# Patient Record
Sex: Female | Born: 1985 | Race: White | Hispanic: No | Marital: Married | Smoking: Never smoker
Health system: Southern US, Community
[De-identification: ages and names within clinical notes are randomized; demographics above are authoritative.]

## PROBLEM LIST (undated history)

## (undated) DIAGNOSIS — R7303 Prediabetes: Secondary | ICD-10-CM

## (undated) DIAGNOSIS — K589 Irritable bowel syndrome without diarrhea: Secondary | ICD-10-CM

## (undated) DIAGNOSIS — K219 Gastro-esophageal reflux disease without esophagitis: Secondary | ICD-10-CM

## (undated) DIAGNOSIS — J45909 Unspecified asthma, uncomplicated: Secondary | ICD-10-CM

## (undated) DIAGNOSIS — N946 Dysmenorrhea, unspecified: Secondary | ICD-10-CM

## (undated) DIAGNOSIS — R51 Headache: Secondary | ICD-10-CM

## (undated) DIAGNOSIS — E538 Deficiency of other specified B group vitamins: Secondary | ICD-10-CM

## (undated) DIAGNOSIS — R519 Headache, unspecified: Secondary | ICD-10-CM

## (undated) DIAGNOSIS — E282 Polycystic ovarian syndrome: Secondary | ICD-10-CM

## (undated) DIAGNOSIS — Z8619 Personal history of other infectious and parasitic diseases: Secondary | ICD-10-CM

## (undated) HISTORY — PX: WISDOM TOOTH EXTRACTION: SHX21

## (undated) HISTORY — DX: Dysmenorrhea, unspecified: N94.6

## (undated) HISTORY — DX: Deficiency of other specified B group vitamins: E53.8

## (undated) HISTORY — DX: Personal history of other infectious and parasitic diseases: Z86.19

## (undated) HISTORY — DX: Irritable bowel syndrome, unspecified: K58.9

## (undated) HISTORY — DX: Prediabetes: R73.03

---
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
## 2007-10-11 ENCOUNTER — Encounter: Admission: RE | Admit: 2007-10-11 | Discharge: 2007-10-11 | Payer: Self-pay

## 2007-12-12 ENCOUNTER — Ambulatory Visit: Payer: Self-pay | Admitting: Obstetrics & Gynecology

## 2007-12-12 ENCOUNTER — Inpatient Hospital Stay (HOSPITAL_COMMUNITY): Admission: AD | Admit: 2007-12-12 | Discharge: 2007-12-13 | Payer: Self-pay | Admitting: Obstetrics & Gynecology

## 2010-11-02 LAB — CBC
MCHC: 32.1
MCV: 87.9
Platelets: 235
RBC: 4.58
RDW: 15.8 — ABNORMAL HIGH

## 2010-11-02 LAB — RPR: RPR Ser Ql: NONREACTIVE

## 2016-09-02 ENCOUNTER — Ambulatory Visit: Payer: Self-pay | Admitting: Surgery

## 2016-09-02 NOTE — H&P (Signed)
Patricia [place] 09/02/2016 2:07 PM Location: [facility] Patient #: [id number] DOB: 03/29/1985 Married / Language: [place] / Race: White Female  History of Present Illness (Chelsea A. Fredricka Bonineonnor MD; 09/02/2016 2:44 PM) Patient words: This is a very pleasant and otherwise healthy 31 year old woman who was referred to discuss laparoscopic cholecystectomy. She began having bouts of right upper quadrant pain with associated nausea in March of this year and she has had 5 attacks since then. The pain typically lasts 2-5 hours and is located in the right subcostal margin. She has altered her diet to a very high-fiber and fat free diet and has actually not had an attack since May of this year. She was evaluated by her primary doctor and an ultrasound was performed and reportedly did show gallstones. She had lab work done that was unremarkable. She was recommended to pursue cholecystectomy however wanted a second opinion and therefore she went to see Dr. Doretha ImusBrambhatt in gastroenterology who has referred her here. She has never had any abdominal surgery before. She also complains of periumbilical pain after physical activities such as Patsy LagerYolanda and thinks she may have a hernia.  She does not smoke or use alcohol. She just moved here from [place] a few months ago. She is a mother to 2 kids.  The patient is a 31 year old female.   Past Surgical History Christianne Dolin([place], [place]; 09/02/2016 2:07 PM) Oral Surgery  Diagnostic Studies History Christianne Dolin(Christ[place], [place]; 09/02/2016 2:07 PM) Colonoscopy never Mammogram never Pap Smear 1-5 years ago  Allergies Christianne Dolin(Christen Lambert, RMA; 09/02/2016 2:09 PM) No Known Allergies 09/02/2016  Medication History Christianne Dolin(Christen Lambert, RMA; 09/02/2016 2:10 PM) MetFORMIN HCl (500MG  Tablet, Oral) Active. Medications Reconciled  Social History Christianne Dolin([place], [place]; 09/02/2016 2:07 PM) Caffeine use Carbonated beverages, Coffee, Tea. No alcohol use No drug use Tobacco use  Never smoker.  Family History Christianne Dolin(Christen [place], [place]; 09/02/2016 2:07 PM) Arthritis Father. Cerebrovascular Accident Father. Diabetes Mellitus Mother. Hypertension Father. Respiratory Condition Mother. Thyroid problems Mother.  Pregnancy / Birth History Christianne Dolin(Christen [place], [place]; 09/02/2016 2:07 PM) Age at menarche 12 years. Gravida 1 Irregular periods Length (months) of breastfeeding >24 Maternal age 30-25 Para 1  Other Problems Christianne Dolin(Christen [place], [place]; 09/02/2016 2:07 PM) Asthma Cholelithiasis Gastroesophageal Reflux Disease High blood pressure Hypercholesterolemia Migraine Headache Umbilical Hernia Repair     Review of Systems Christianne Dolin(Christen Lambert RMA; 09/02/2016 2:07 PM) General Not Present- Appetite Loss, Chills, Fatigue, Fever, Night Sweats, Weight Gain and Weight Loss. Skin Not Present- Change in Wart/Mole, Dryness, Hives, Jaundice, New Lesions, Non-Healing Wounds, Rash and Ulcer. HEENT Present- Wears glasses/contact lenses. Not Present- Earache, Hearing Loss, Hoarseness, Nose Bleed, Oral Ulcers, Ringing in the Ears, Seasonal Allergies, Sinus Pain, Sore Throat, Visual Disturbances and Yellow Eyes. Respiratory Not Present- Bloody sputum, Chronic Cough, Difficulty Breathing, Snoring and Wheezing. Breast Not Present- Breast Mass, Breast Pain, Nipple Discharge and Skin Changes. Cardiovascular Not Present- Chest Pain, Difficulty Breathing Lying Down, Leg Cramps, Palpitations, Rapid Heart Rate, Shortness of Breath and Swelling of Extremities. Gastrointestinal Not Present- Abdominal Pain, Bloating, Bloody Stool, Change in Bowel Habits, Chronic diarrhea, Constipation, Difficulty Swallowing, Excessive gas, Gets full quickly at meals, Hemorrhoids, Indigestion, Nausea, Rectal Pain and Vomiting. Female Genitourinary Not Present- Frequency, Nocturia, Painful Urination, Pelvic Pain and Urgency. Musculoskeletal Not Present- Back Pain, Joint Pain, Joint Stiffness, Muscle Pain,  Muscle Weakness and Swelling of Extremities. Neurological Not Present- Decreased Memory, Fainting, Headaches, Numbness, Seizures, Tingling, Tremor, Trouble walking and Weakness. Psychiatric Not Present- Anxiety, Bipolar, Change in Sleep Pattern,  Depression, Fearful and Frequent crying. Endocrine Not Present- Cold Intolerance, Excessive Hunger, Hair Changes, Heat Intolerance, Hot flashes and New Diabetes. Hematology Not Present- Blood Thinners, Easy Bruising, Excessive bleeding, Gland problems, HIV and Persistent Infections.  Vitals Christianne Dolin(Christen Lambert RMA; 09/02/2016 2:11 PM) 09/02/2016 2:10 PM Weight: 200 lb Height: 63in Body Surface Area: 1.93 m Body Mass Index: 35.43 kg/m  Temp.: 24F  Pulse: 106 (Regular)  BP: 130/90 (Sitting, Left Arm, Standard)      Physical Exam (Chelsea A. Fredricka Bonineonnor MD; 09/02/2016 2:46 PM)  General Note: She is alert and well-appearing  Integumentary Note: Skin is warm and dry  Eye Note: No scleral icterus. Pupils equally round and reactive.  Chest and Lung Exam Note: Unlabored respirations. Symmetrical air entry.  Cardiovascular Note: Regular rate and rhythm, no pedal edema  Abdomen Note: Soft, nontender nondistended. No mass or organomegaly. There is a midline diastases of about 2-1/2 cm in the upper abdomen which coincides with a small umbilical hernia.  Neurologic Note: Grossly intact, normal gait  Neuropsychiatric Note: Normal mood and affect, appropriate insight  Musculoskeletal Note: strength symmetrical throughout, no deformity    Assessment & Plan (Chelsea A. Connor MD; 09/02/2016 2:48 PM)  BILIARY COLIC (K80.50) Story: Her pain and symptoms are classic for biliary colic in the setting of gallstones. Long discussion about the nature of gallstone disease and the indications for surgery. I advised her that surgery is indicated and he will begin having symptoms that she has. We discussed the role for observant management and  continuing a low-fat diet, and discussed the risks of gallstone disease including cholecystitis, cholangitis, pancreatitis and the signs and symptoms of each of these should prompt her to go to the emergency department. We also discussed laparoscopic cholecystectomy, including the nature of the surgery, the anticipated perioperative recovery, the risks of bleeding, infection, pain, scarring, intra-abdominal injury specifically to the common bile duct and sequelae, conversion to open surgery, and the risks of general anesthesia including blood clots, stroke, heart attack, pneumonia and death. She expressed understanding and all her questions were answered today. I recommended that she go ahead with laparoscopic cholecystectomy to which she is agreeable. We will get her scheduled in the coming weeks.

## 2016-10-04 ENCOUNTER — Encounter (HOSPITAL_COMMUNITY): Payer: Self-pay | Admitting: *Deleted

## 2016-10-04 NOTE — Progress Notes (Signed)
Spoke with pt fo pre-op call. Pt denies cardiac history, chest pain or sob. Pt states she is not diabetic. She takes Metformin for PCOS.

## 2016-10-05 ENCOUNTER — Ambulatory Visit (HOSPITAL_COMMUNITY): Payer: 59 | Admitting: Registered Nurse

## 2016-10-05 ENCOUNTER — Encounter (HOSPITAL_COMMUNITY)
Admission: RE | Disposition: A | Discharge status: Home / Self Care | Payer: Self-pay | Source: Ambulatory Visit | Attending: Surgery

## 2016-10-05 ENCOUNTER — Encounter (HOSPITAL_COMMUNITY): Payer: Self-pay | Admitting: *Deleted

## 2016-10-05 ENCOUNTER — Ambulatory Visit (HOSPITAL_COMMUNITY)
Admission: RE | Admit: 2016-10-05 | Discharge: 2016-10-05 | Disposition: A | Discharge status: Home / Self Care | Payer: 59 | Source: Ambulatory Visit | Attending: Surgery | Admitting: Surgery

## 2016-10-05 DIAGNOSIS — Z79899 Other long term (current) drug therapy: Secondary | ICD-10-CM | POA: Diagnosis not present

## 2016-10-05 DIAGNOSIS — K8064 Calculus of gallbladder and bile duct with chronic cholecystitis without obstruction: Secondary | ICD-10-CM | POA: Diagnosis present

## 2016-10-05 DIAGNOSIS — K219 Gastro-esophageal reflux disease without esophagitis: Secondary | ICD-10-CM | POA: Insufficient documentation

## 2016-10-05 DIAGNOSIS — K429 Umbilical hernia without obstruction or gangrene: Secondary | ICD-10-CM | POA: Insufficient documentation

## 2016-10-05 DIAGNOSIS — E7800 Pure hypercholesterolemia, unspecified: Secondary | ICD-10-CM | POA: Insufficient documentation

## 2016-10-05 DIAGNOSIS — E282 Polycystic ovarian syndrome: Secondary | ICD-10-CM | POA: Diagnosis not present

## 2016-10-05 HISTORY — DX: Gastro-esophageal reflux disease without esophagitis: K21.9

## 2016-10-05 HISTORY — DX: Headache, unspecified: R51.9

## 2016-10-05 HISTORY — DX: Unspecified asthma, uncomplicated: J45.909

## 2016-10-05 HISTORY — DX: Polycystic ovarian syndrome: E28.2

## 2016-10-05 HISTORY — DX: Headache: R51

## 2016-10-05 HISTORY — PX: CHOLECYSTECTOMY: SHX55

## 2016-10-05 LAB — CBC
HEMATOCRIT: 36.8 % (ref 36.0–46.0)
HEMOGLOBIN: 12 g/dL (ref 12.0–15.0)
MCH: 27.6 pg (ref 26.0–34.0)
MCHC: 32.6 g/dL (ref 30.0–36.0)
MCV: 84.8 fL (ref 78.0–100.0)
Platelets: 263 10*3/uL (ref 150–400)
RBC: 4.34 MIL/uL (ref 3.87–5.11)
RDW: 13.1 % (ref 11.5–15.5)
WBC: 6.1 10*3/uL (ref 4.0–10.5)

## 2016-10-05 LAB — I-STAT BETA HCG BLOOD, ED (NOT ORDERABLE): I-stat hCG, quantitative: <5 m[IU]/mL (ref <5)

## 2016-10-05 LAB — HCG, SERUM, QUALITATIVE: PREG SERUM: NEGATIVE

## 2016-10-05 SURGERY — LAPAROSCOPIC CHOLECYSTECTOMY
Anesthesia: General | Site: Abdomen

## 2016-10-05 MED ORDER — DEXAMETHASONE SODIUM PHOSPHATE 4 MG/ML IJ SOLN
INTRAMUSCULAR | Status: DC | PRN
  Administered 2016-10-05: 10 mg via INTRAVENOUS

## 2016-10-05 MED ORDER — PHENYLEPHRINE HCL 10 MG/ML IJ SOLN
INTRAMUSCULAR | Status: DC | PRN
  Administered 2016-10-05: 40 ug via INTRAVENOUS
  Administered 2016-10-05: 80 ug via INTRAVENOUS

## 2016-10-05 MED ORDER — GABAPENTIN 300 MG PO CAPS
300.0000 mg | ORAL_CAPSULE | ORAL | Status: AC
  Administered 2016-10-05: 300 mg via ORAL
  Filled 2016-10-05: qty 1

## 2016-10-05 MED ORDER — PROPOFOL 10 MG/ML IV BOLUS
INTRAVENOUS | Status: AC
  Filled 2016-10-05: qty 20

## 2016-10-05 MED ORDER — CELECOXIB 200 MG PO CAPS
400.0000 mg | ORAL_CAPSULE | ORAL | Status: AC
  Administered 2016-10-05: 400 mg via ORAL
  Filled 2016-10-05: qty 2

## 2016-10-05 MED ORDER — CHLORHEXIDINE GLUCONATE 4 % EX LIQD: 60.0000 mL | Freq: Once | CUTANEOUS | Status: DC

## 2016-10-05 MED ORDER — ACETAMINOPHEN 650 MG RE SUPP: 650.0000 mg | RECTAL | Status: DC | PRN

## 2016-10-05 MED ORDER — ROCURONIUM BROMIDE 100 MG/10ML IV SOLN
INTRAVENOUS | Status: DC | PRN
  Administered 2016-10-05: 20 mg via INTRAVENOUS
  Administered 2016-10-05: 40 mg via INTRAVENOUS

## 2016-10-05 MED ORDER — MIDAZOLAM HCL 5 MG/5ML IJ SOLN
INTRAMUSCULAR | Status: DC | PRN
  Administered 2016-10-05: 2 mg via INTRAVENOUS

## 2016-10-05 MED ORDER — FENTANYL CITRATE (PF) 100 MCG/2ML IJ SOLN: 25.0000 ug | INTRAMUSCULAR | Status: DC | PRN

## 2016-10-05 MED ORDER — PHENYLEPHRINE 40 MCG/ML (10ML) SYRINGE FOR IV PUSH (FOR BLOOD PRESSURE SUPPORT)
PREFILLED_SYRINGE | INTRAVENOUS | Status: AC
  Filled 2016-10-05: qty 10

## 2016-10-05 MED ORDER — HYDROCODONE-ACETAMINOPHEN 5-325 MG PO TABS: 1.0000 | ORAL_TABLET | Freq: Four times a day (QID) | ORAL | 0 refills | Status: DC | PRN

## 2016-10-05 MED ORDER — HYDROMORPHONE HCL 1 MG/ML IJ SOLN: 0.2500 mg | INTRAMUSCULAR | Status: DC | PRN

## 2016-10-05 MED ORDER — FENTANYL CITRATE (PF) 250 MCG/5ML IJ SOLN
INTRAMUSCULAR | Status: AC
  Filled 2016-10-05: qty 5

## 2016-10-05 MED ORDER — DOCUSATE SODIUM 100 MG PO CAPS: 100.0000 mg | ORAL_CAPSULE | Freq: Two times a day (BID) | ORAL | 0 refills | Status: AC

## 2016-10-05 MED ORDER — LACTATED RINGERS IV SOLN
INTRAVENOUS | Status: DC
  Administered 2016-10-05 (×2): via INTRAVENOUS

## 2016-10-05 MED ORDER — FENTANYL CITRATE (PF) 100 MCG/2ML IJ SOLN
INTRAMUSCULAR | Status: DC | PRN
  Administered 2016-10-05: 25 ug via INTRAVENOUS
  Administered 2016-10-05: 100 ug via INTRAVENOUS
  Administered 2016-10-05: 50 ug via INTRAVENOUS

## 2016-10-05 MED ORDER — SODIUM CHLORIDE 0.9 % IV SOLN: 250.0000 mL | INTRAVENOUS | Status: DC | PRN

## 2016-10-05 MED ORDER — SODIUM CHLORIDE 0.9% FLUSH: 3.0000 mL | Freq: Two times a day (BID) | INTRAVENOUS | Status: DC

## 2016-10-05 MED ORDER — LIDOCAINE 2% (20 MG/ML) 5 ML SYRINGE
INTRAMUSCULAR | Status: AC
  Filled 2016-10-05: qty 5

## 2016-10-05 MED ORDER — ROCURONIUM BROMIDE 10 MG/ML (PF) SYRINGE
PREFILLED_SYRINGE | INTRAVENOUS | Status: AC
  Filled 2016-10-05: qty 5

## 2016-10-05 MED ORDER — OXYCODONE HCL 5 MG PO TABS
5.0000 mg | ORAL_TABLET | ORAL | Status: DC | PRN
  Administered 2016-10-05: 10 mg via ORAL

## 2016-10-05 MED ORDER — BUPIVACAINE-EPINEPHRINE (PF) 0.25% -1:200000 IJ SOLN
INTRAMUSCULAR | Status: AC
  Filled 2016-10-05: qty 30

## 2016-10-05 MED ORDER — DEXAMETHASONE SODIUM PHOSPHATE 10 MG/ML IJ SOLN
INTRAMUSCULAR | Status: AC
  Filled 2016-10-05: qty 1

## 2016-10-05 MED ORDER — MIDAZOLAM HCL 2 MG/2ML IJ SOLN
INTRAMUSCULAR | Status: AC
  Filled 2016-10-05: qty 2

## 2016-10-05 MED ORDER — GLYCOPYRROLATE 0.2 MG/ML IJ SOLN
INTRAMUSCULAR | Status: DC | PRN
  Administered 2016-10-05: 0.4 mg via INTRAVENOUS

## 2016-10-05 MED ORDER — NEOSTIGMINE METHYLSULFATE 5 MG/5ML IV SOSY
PREFILLED_SYRINGE | INTRAVENOUS | Status: AC
  Filled 2016-10-05: qty 15

## 2016-10-05 MED ORDER — SODIUM CHLORIDE 0.9% FLUSH: 3.0000 mL | INTRAVENOUS | Status: DC | PRN

## 2016-10-05 MED ORDER — NEOSTIGMINE METHYLSULFATE 10 MG/10ML IV SOLN
INTRAVENOUS | Status: DC | PRN
  Administered 2016-10-05: 4 mg via INTRAVENOUS

## 2016-10-05 MED ORDER — OXYCODONE HCL 5 MG PO TABS
ORAL_TABLET | ORAL | Status: AC
  Administered 2016-10-05: 10 mg via ORAL
  Filled 2016-10-05: qty 2

## 2016-10-05 MED ORDER — CEFAZOLIN SODIUM-DEXTROSE 2-4 GM/100ML-% IV SOLN
2.0000 g | INTRAVENOUS | Status: AC
  Administered 2016-10-05: 2 g via INTRAVENOUS

## 2016-10-05 MED ORDER — SODIUM CHLORIDE 0.9 % IR SOLN
Status: DC | PRN
  Administered 2016-10-05: 1000 mL

## 2016-10-05 MED ORDER — ONDANSETRON HCL 4 MG/2ML IJ SOLN
INTRAMUSCULAR | Status: DC | PRN
  Administered 2016-10-05: 4 mg via INTRAVENOUS

## 2016-10-05 MED ORDER — CEFAZOLIN SODIUM-DEXTROSE 2-4 GM/100ML-% IV SOLN
INTRAVENOUS | Status: AC
  Filled 2016-10-05: qty 100

## 2016-10-05 MED ORDER — PROPOFOL 10 MG/ML IV BOLUS
INTRAVENOUS | Status: DC | PRN
  Administered 2016-10-05: 130 mg via INTRAVENOUS

## 2016-10-05 MED ORDER — ACETAMINOPHEN 325 MG PO TABS: 650.0000 mg | ORAL_TABLET | ORAL | Status: DC | PRN

## 2016-10-05 MED ORDER — BUPIVACAINE-EPINEPHRINE 0.25% -1:200000 IJ SOLN
INTRAMUSCULAR | Status: DC | PRN
  Administered 2016-10-05: 12 mL

## 2016-10-05 MED ORDER — ACETAMINOPHEN 500 MG PO TABS
1000.0000 mg | ORAL_TABLET | ORAL | Status: AC
  Administered 2016-10-05: 1000 mg via ORAL
  Filled 2016-10-05: qty 2

## 2016-10-05 MED ORDER — ESMOLOL HCL 100 MG/10ML IV SOLN
INTRAVENOUS | Status: AC
  Filled 2016-10-05: qty 10

## 2016-10-05 MED ORDER — LIDOCAINE 2% (20 MG/ML) 5 ML SYRINGE
INTRAMUSCULAR | Status: DC | PRN
  Administered 2016-10-05: 60 mg via INTRAVENOUS

## 2016-10-05 MED ORDER — 0.9 % SODIUM CHLORIDE (POUR BTL) OPTIME
TOPICAL | Status: DC | PRN
  Administered 2016-10-05: 1000 mL

## 2016-10-05 MED ORDER — ONDANSETRON HCL 4 MG/2ML IJ SOLN
INTRAMUSCULAR | Status: AC
Start: 2016-10-05 — End: ?
  Filled 2016-10-05: qty 2

## 2016-10-05 SURGICAL SUPPLY — 43 items
ADH SKN CLS APL DERMABOND .7 (GAUZE/BANDAGES/DRESSINGS) ×1
APPLIER CLIP 5 13 M/L LIGAMAX5 (MISCELLANEOUS) ×3
APR CLP MED LRG 5 ANG JAW (MISCELLANEOUS) ×1
BAG SPEC RTRVL LRG 6X4 10 (ENDOMECHANICALS) ×1
BLADE CLIPPER SURG (BLADE) IMPLANT
CANISTER SUCT 3000ML PPV (MISCELLANEOUS) ×3 IMPLANT
CHLORAPREP W/TINT 26ML (MISCELLANEOUS) ×3 IMPLANT
CLIP APPLIE 5 13 M/L LIGAMAX5 (MISCELLANEOUS) ×1 IMPLANT
COVER SURGICAL LIGHT HANDLE (MISCELLANEOUS) ×3 IMPLANT
DERMABOND ADVANCED (GAUZE/BANDAGES/DRESSINGS) ×2
DERMABOND ADVANCED .7 DNX12 (GAUZE/BANDAGES/DRESSINGS) ×1 IMPLANT
ELECT REM PT RETURN 9FT ADLT (ELECTROSURGICAL) ×3
ELECTRODE REM PT RTRN 9FT ADLT (ELECTROSURGICAL) ×1 IMPLANT
GLOVE BIO SURGEON STRL SZ 6 (GLOVE) ×5 IMPLANT
GLOVE BIOGEL PI IND STRL 6.5 (GLOVE) ×1 IMPLANT
GLOVE BIOGEL PI IND STRL 7.0 (GLOVE) IMPLANT
GLOVE BIOGEL PI INDICATOR 6.5 (GLOVE) ×2
GLOVE BIOGEL PI INDICATOR 7.0 (GLOVE) ×6
GLOVE INDICATOR 6.5 STRL GRN (GLOVE) ×2 IMPLANT
GOWN STRL REUS W/ TWL LRG LVL3 (GOWN DISPOSABLE) ×3 IMPLANT
GOWN STRL REUS W/TWL LRG LVL3 (GOWN DISPOSABLE) ×9
GRASPER SUT TROCAR 14GX15 (MISCELLANEOUS) ×3 IMPLANT
KIT BASIN OR (CUSTOM PROCEDURE TRAY) ×3 IMPLANT
KIT ROOM TURNOVER OR (KITS) ×3 IMPLANT
NDL HYPO 25GX1X1/2 BEV (NEEDLE) IMPLANT
NDL INSUFFLATION 14GA 120MM (NEEDLE) ×1 IMPLANT
NEEDLE HYPO 25GX1X1/2 BEV (NEEDLE) ×3 IMPLANT
NEEDLE INSUFFLATION 14GA 120MM (NEEDLE) ×3 IMPLANT
NS IRRIG 1000ML POUR BTL (IV SOLUTION) ×3 IMPLANT
PAD ARMBOARD 7.5X6 YLW CONV (MISCELLANEOUS) ×3 IMPLANT
POUCH SPECIMEN RETRIEVAL 10MM (ENDOMECHANICALS) ×3 IMPLANT
SCISSORS LAP 5X35 DISP (ENDOMECHANICALS) ×3 IMPLANT
SET IRRIG TUBING LAPAROSCOPIC (IRRIGATION / IRRIGATOR) ×3 IMPLANT
SLEEVE ENDOPATH XCEL 5M (ENDOMECHANICALS) ×6 IMPLANT
SPECIMEN JAR SMALL (MISCELLANEOUS) ×3 IMPLANT
SUT MNCRL AB 4-0 PS2 18 (SUTURE) ×3 IMPLANT
SYR CONTROL 10ML LL (SYRINGE) ×2 IMPLANT
TOWEL OR 17X24 6PK STRL BLUE (TOWEL DISPOSABLE) ×3 IMPLANT
TOWEL OR 17X26 10 PK STRL BLUE (TOWEL DISPOSABLE) IMPLANT
TRAY LAPAROSCOPIC MC (CUSTOM PROCEDURE TRAY) ×3 IMPLANT
TROCAR XCEL NON-BLD 11X100MML (ENDOMECHANICALS) ×3 IMPLANT
TROCAR XCEL NON-BLD 5MMX100MML (ENDOMECHANICALS) ×3 IMPLANT
TUBING INSUFFLATION (TUBING) ×3 IMPLANT

## 2016-10-05 NOTE — Anesthesia Procedure Notes (Signed)
Procedure Name: Intubation Date/Time: 10/05/2016 10:03 AM Performed by: Caren MacadamARTER, Donivin Wirt W Pre-anesthesia Checklist: Patient identified, Emergency Drugs available, Suction available and Patient being monitored Patient Re-evaluated:Patient Re-evaluated prior to induction Oxygen Delivery Method: Circle system utilized Preoxygenation: Pre-oxygenation with 100% oxygen Induction Type: IV induction Ventilation: Mask ventilation without difficulty Laryngoscope Size: Miller and 2 Grade View: Grade I Tube type: Oral Tube size: 7.0 mm Number of attempts: 1 Airway Equipment and Method: Stylet Placement Confirmation: ETT inserted through vocal cords under direct vision,  positive ETCO2 and breath sounds checked- equal and bilateral Secured at: 22 cm Tube secured with: Tape Dental Injury: Teeth and Oropharynx as per pre-operative assessment

## 2016-10-05 NOTE — Anesthesia Postprocedure Evaluation (Signed)
Anesthesia Post Note  Patient: Patricia Weaver  Procedure(s) Performed: Procedure(s) (LRB): LAPAROSCOPIC CHOLECYSTECTOMY (N/A)     Patient location during evaluation: PACU Anesthesia Type: General Level of consciousness: awake and alert Pain management: pain level controlled Vital Signs Assessment: post-procedure vital signs reviewed and stable Respiratory status: spontaneous breathing, nonlabored ventilation and respiratory function stable Cardiovascular status: blood pressure returned to baseline and stable Postop Assessment: no signs of nausea or vomiting Anesthetic complications: no    Last Vitals:  Vitals:   10/05/16 1155 10/05/16 1211  BP: 109/74 117/76  Pulse: 84 89  Resp: 15   Temp: 36.8 C   SpO2: 99% 100%    Last Pain:  Vitals:   10/05/16 1211  TempSrc:   PainSc: 4                  Layman Gully,W. EDMOND

## 2016-10-05 NOTE — Transfer of Care (Signed)
Immediate Anesthesia Transfer of Care Note  Patient: Patricia Weaver  Procedure(s) Performed: Procedure(s): LAPAROSCOPIC CHOLECYSTECTOMY (N/A)  Patient Location: PACU  Anesthesia Type:General  Level of Consciousness: sedated  Airway & Oxygen Therapy: Patient Spontanous Breathing and Patient connected to face mask oxygen  Post-op Assessment: Report given to RN and Post -op Vital signs reviewed and stable  Post vital signs: Reviewed and stable  Last Vitals:  Vitals:   10/05/16 0820 10/05/16 1126  BP: 132/76 118/71  Pulse: 81 74  Resp: 18 15  Temp: 36.7 C 36.9 C  SpO2: 99% 100%    Last Pain:  Vitals:   10/05/16 0820  TempSrc: Oral      Patients Stated Pain Goal: 7 (10/05/16 0829)  Complications: No apparent anesthesia complications

## 2016-10-05 NOTE — Anesthesia Preprocedure Evaluation (Addendum)
Anesthesia Evaluation  Patient identified by MRN, date of birth, ID band Patient awake    Reviewed: Allergy & Precautions, H&P , NPO status , Patient's Chart, lab work & pertinent test results  Airway Mallampati: II  TM Distance: >3 FB Neck ROM: Full    Dental no notable dental hx. (+) Teeth Intact, Dental Advisory Given   Pulmonary asthma ,    Pulmonary exam normal breath sounds clear to auscultation       Cardiovascular negative cardio ROS   Rhythm:Regular Rate:Normal     Neuro/Psych  Headaches, negative psych ROS   GI/Hepatic Neg liver ROS, GERD  Controlled,  Endo/Other  negative endocrine ROS  Renal/GU negative Renal ROS  negative genitourinary   Musculoskeletal   Abdominal   Peds  Hematology negative hematology ROS (+)   Anesthesia Other Findings   Reproductive/Obstetrics negative OB ROS                            Anesthesia Physical Anesthesia Plan  ASA: II  Anesthesia Plan: General   Post-op Pain Management:    Induction: Intravenous  PONV Risk Score and Plan: 4 or greater and Ondansetron, Dexamethasone and Midazolam  Airway Management Planned: Oral ETT  Additional Equipment:   Intra-op Plan:   Post-operative Plan: Extubation in OR  Informed Consent: I have reviewed the patients History and Physical, chart, labs and discussed the procedure including the risks, benefits and alternatives for the proposed anesthesia with the patient or authorized representative who has indicated his/her understanding and acceptance.   Dental advisory given  Plan Discussed with: CRNA  Anesthesia Plan Comments:        Anesthesia Quick Evaluation

## 2016-10-05 NOTE — H&P (Signed)
Patricia Weaver DOB: Jul 24, 1985 Married / Language: Patricia Weaver / Race: White Female  History of Present Illness  Patient words: This is a very pleasant and otherwise healthy 31 year old woman who was referred to discuss laparoscopic cholecystectomy. She began having bouts of right upper quadrant pain with associated nausea in March of this year and she has had 5 attacks since then. The pain typically lasts 2-5 hours and is located in the right subcostal margin. She has altered her diet to a very high-fiber and fat free diet and has actually not had an attack since May of this year. She was evaluated by her primary doctor and an ultrasound was performed and reportedly did show gallstones. She had lab work done that was unremarkable. She was recommended to pursue cholecystectomy however wanted a second opinion and therefore she went to see Dr. Doretha Weaver in gastroenterology who has referred her here. She has never had any abdominal surgery before. She also complains of periumbilical pain after physical activities such as Patricia Weaver and thinks she may have a hernia.  She does not smoke or use alcohol. She just moved here from [place] a few months ago. She is a Weaver to 2 kids.  Past Surgical History  Oral Surgery  Diagnostic Studies History  Colonoscopy never Mammogram never Pap Smear 1-5 years ago  Allergies  No Known Allergies 09/02/2016  Medication History  Patricia Weaver (500MG  Tablet, Oral) Active. Medications Reconciled  Social History  Caffeine use Carbonated beverages, Coffee, Tea. No alcohol use No drug use Tobacco use Never smoker.  Family History  Patricia Weaver. Patricia Weaver. Patricia Weaver. Patricia Weaver. Patricia Weaver. Patricia Weaver.  Pregnancy / Birth History Age at menarche 12 years. Gravida 1 Irregular periods Length (months) of breastfeeding >24 Maternal age  36-25 Para 1  Other Problems  Asthma Cholelithiasis Gastroesophageal Reflux Disease High blood pressure Hypercholesterolemia Migraine Headache Umbilical Hernia Repair     Review of Systems General Not Present- Appetite Loss, Chills, Fatigue, Fever, Night Sweats, Weight Gain and Weight Loss. Skin Not Present- Change in Wart/Mole, Dryness, Hives, Jaundice, New Lesions, Non-Healing Wounds, Rash and Ulcer. HEENT Present- Wears glasses/contact lenses. Not Present- Earache, Hearing Loss, Hoarseness, Nose Bleed, Oral Ulcers, Ringing in the Ears, Seasonal Allergies, Sinus Pain, Sore Throat, Visual Disturbances and Yellow Eyes. Patricia Not Present- Bloody sputum, Chronic Cough, Difficulty Breathing, Snoring and Wheezing. Breast Not Present- Breast Mass, Breast Pain, Nipple Discharge and Skin Changes. Cardiovascular Not Present- Chest Pain, Difficulty Breathing Lying Down, Leg Cramps, Palpitations, Rapid Heart Rate, Shortness of Breath and Swelling of Extremities. Gastrointestinal Not Present- Abdominal Pain, Bloating, Bloody Stool, Change in Bowel Habits, Chronic diarrhea, Constipation, Difficulty Swallowing, Excessive gas, Gets full quickly at meals, Hemorrhoids, Indigestion, Nausea, Rectal Pain and Vomiting. Female Genitourinary Not Present- Frequency, Nocturia, Painful Urination, Pelvic Pain and Urgency. Musculoskeletal Not Present- Back Pain, Joint Pain, Joint Stiffness, Muscle Pain, Muscle Weakness and Swelling of Extremities. Neurological Not Present- Decreased Memory, Fainting, Headaches, Numbness, Seizures, Tingling, Tremor, Trouble walking and Weakness. Psychiatric Not Present- Anxiety, Bipolar, Change in Sleep Pattern, Depression, Fearful and Frequent crying. Endocrine Not Present- Cold Intolerance, Excessive Hunger, Hair Changes, Heat Intolerance, Hot flashes and New Patricia. Hematology Not Present- Blood Thinners, Easy Bruising, Excessive bleeding, Gland problems, HIV  and Persistent Infections.  Vitals:   10/05/16 0820  BP: 132/76  Pulse: 81  Resp: 18  Temp: 98 F (36.7 C)  SpO2: 99%      Physical Exam   General Note: She is  alert and well-appearing  Integumentary Note: Skin is warm and dry  Eye Note: No scleral icterus. Pupils equally round and reactive.  Chest and Lung Exam Note: Unlabored respirations. Symmetrical air entry.  Cardiovascular Note: Regular rate and rhythm, no pedal edema  Abdomen Note: Soft, nontender nondistended. No mass or organomegaly. There is a midline diastases of about 2-1/2 cm in the upper abdomen which coincides with a small umbilical hernia.  Neurologic Note: Grossly intact, normal gait  Neuropsychiatric Note: Normal mood and affect, appropriate insight  Musculoskeletal Note: strength symmetrical throughout, no deformity    Assessment & Plan   BILIARY COLIC (K80.50) Story: Her pain and symptoms are classic for biliary colic in the setting of gallstones. Long discussion about the nature of gallstone disease and the indications for surgery. I advised her that surgery is indicated and he will begin having symptoms that she has. We discussed the role for observant management and continuing a low-fat diet, and discussed the risks of gallstone disease including cholecystitis, cholangitis, pancreatitis and the signs and symptoms of each of these should prompt her to go to the emergency department. We also discussed laparoscopic cholecystectomy, including the nature of the surgery, the anticipated perioperative recovery, the risks of bleeding, infection, pain, scarring, intra-abdominal injury specifically to the common bile duct and sequelae, conversion to open surgery, and the risks of general anesthesia including blood clots, stroke, heart attack, pneumonia and death. She expressed understanding and all her questions were answered today. I recommended that she go ahead with laparoscopic  cholecystectomy to which she is agreeable. We will get her scheduled in the coming weeks.

## 2016-10-05 NOTE — Discharge Instructions (Signed)
LAPAROSCOPIC SURGERY: POST OP INSTRUCTIONS ° °###################################################################### ° °EAT °Gradually transition to a high fiber diet with a fiber supplement over the next few weeks after discharge.  Start with a pureed / full liquid diet (see below) ° °WALK °Walk an hour a day.  Control your pain to do that.   ° °CONTROL PAIN °Control pain so that you can walk, sleep, tolerate sneezing/coughing, go up/down stairs. ° °HAVE A BOWEL MOVEMENT DAILY °Keep your bowels regular to avoid problems.  OK to try a laxative to override constipation.  OK to use an antidairrheal to slow down diarrhea.  Call if not better after 2 tries ° °CALL IF YOU HAVE PROBLEMS/CONCERNS °Call if you are still struggling despite following these instructions. °Call if you have concerns not answered by these instructions ° °###################################################################### ° ° ° °1. DIET: Follow a light bland diet the first 24 hours after arrival home, such as soup, liquids, crackers, etc.  Be sure to include lots of fluids daily.  Avoid fast food or heavy meals as your are more likely to get nauseated.  Eat a low fat the next few days after surgery.   °2. Take your usually prescribed home medications unless otherwise directed. °3. PAIN CONTROL: °a. Pain is best controlled by a usual combination of three different methods TOGETHER: °i. Ice/Heat °ii. Over the counter pain medication °iii. Prescription pain medication °b. Most patients will experience some swelling and bruising around the incisions.  Ice packs or heating pads (30-60 minutes up to 6 times a day) will help. Use ice for the first few days to help decrease swelling and bruising, then switch to heat to help relax tight/sore spots and speed recovery.  Some people prefer to use ice alone, heat alone, alternating between ice & heat.  Experiment to what works for you.  Swelling and bruising can take several weeks to resolve.   °c. It is  helpful to take an over-the-counter pain medication regularly for the first few weeks.  Choose one of the following that works best for you: °i. Naproxen (Aleve, etc)  Two 220mg tabs twice a day °ii. Ibuprofen (Advil, etc) Three 200mg tabs four times a day (every meal & bedtime) °iii. Acetaminophen (Tylenol, etc) 500-650mg four times a day (every meal & bedtime) °d. A  prescription for pain medication (such as oxycodone, hydrocodone, etc) should be given to you upon discharge.  Take your pain medication as prescribed.  °i. If you are having problems/concerns with the prescription medicine (does not control pain, nausea, vomiting, rash, itching, etc), please call us [phone] to see if we need to switch you to a different pain medicine that will work better for you and/or control your side effect better. °ii. If you need a refill on your pain medication, please contact your pharmacy.  They will contact our office to request authorization. Prescriptions will not be filled after 5 pm or on week-ends. °4. Avoid getting constipated.  Between the surgery and the pain medications, it is common to experience some constipation.  Increasing fluid intake and taking a fiber supplement (such as Metamucil, Citrucel, FiberCon, MiraLax, etc) 1-2 times a day regularly will usually help prevent this problem from occurring.  A mild laxative (prune juice, Milk of Magnesia, MiraLax, etc) should be taken according to package directions if there are no bowel movements after 48 hours.   °5. Watch out for diarrhea.  If you have many loose bowel movements, simplify your diet to bland foods & liquids for   a few days.  Stop any stool softeners and decrease your fiber supplement.  Switching to mild anti-diarrheal medications (Kayopectate, Pepto Bismol) can help.  If this worsens or does not improve, please call us. °6. Wash / shower every day.  You may shower over the skin glue which is waterproof.  No rubbing, scrubbing, lotions or  ointments to incisions. °7. Skin glue will flake off after about 2 weeks.  You may leave the incision open to air.  You may replace a dressing/Band-Aid to cover the incision for comfort if you wish.  °8. ACTIVITIES as tolerated:   °a. You may resume regular (light) daily activities beginning the next day--such as daily self-care, walking, climbing stairs--gradually increasing activities as tolerated.  If you can walk 30 minutes without difficulty, it is safe to try more intense activity such as jogging, treadmill, bicycling, low-impact aerobics, swimming, etc. °b. Save the most intensive and strenuous activity for last such as sit-ups, heavy lifting, contact sports, etc  Refrain from any heavy lifting or straining until you are off narcotics for pain control.   °c. DO NOT PUSH THROUGH PAIN.  Let pain be your guide: If it hurts to do something, don't do it.  Pain is your body warning you to avoid that activity for another week until the pain goes down. °d. You may drive when you are no longer taking prescription pain medication, you can comfortably wear a seatbelt, and you can safely maneuver your car and apply brakes. °e. You may have sexual intercourse when it is comfortable.  °9. FOLLOW UP in our office °a. Please call [facility] at [phone] to set up an appointment to see your surgeon in the office for a follow-up appointment approximately 2-3 weeks after your surgery. °b. Make sure that you call for this appointment the day you arrive home to insure a convenient appointment time. °10. IF YOU HAVE DISABILITY OR FAMILY LEAVE FORMS, BRING THEM TO THE OFFICE FOR PROCESSING.  DO NOT GIVE THEM TO YOUR DOCTOR. ° ° °WHEN TO CALL US [phone]: °1. Poor pain control °2. Reactions / problems with new medications (rash/itching, nausea, etc)  °3. Fever over 101.5 F (38.5 C) °4. Inability to urinate °5. Nausea and/or vomiting °6. Worsening swelling or bruising °7. Continued bleeding from incision. °8. Increased pain,  redness, or drainage from the incision ° ° The clinic staff is available to answer your questions during regular business hours (8:30am-5pm).  Please don’t hesitate to call and ask to speak to one of our nurses for clinical concerns.  ° If you have a medical emergency, go to the nearest emergency room or call 911. ° A surgeon from [facility] is always on call at the hospitals ° ° °[facility] °[address], [address], [place], [place]  [address] ? °MAIN: [phone] ? TOLL FREE: [phone] ?  °FAX [phone] °[email or web address] ° ° °

## 2016-10-05 NOTE — Op Note (Signed)
Operative Note  Patricia Humphreyaryn Cottier 31 y.o. female [id number]  10/05/2016  Surgeon: Berna Buehelsea A Takina Busser   Assistant: OR staff  Procedure performed: Laparoscopic Cholecystectomy  Preop diagnosis: biliary colic Post-op diagnosis/intraop findings: same  Specimens: gallbladder  EBL: minimal  Complications: none  Description of procedure: After obtaining informed consent the patient was brought to the operating room. Prophylactic antibiotics and subcutaneous heparin were administered. SCD's were applied. General endotracheal anesthesia was initiated and a formal time-out was performed. The abdomen was prepped and draped in the usual sterile fashion and the abdomen was entered using an infraumbilical veress needle after instilling the site with local. Insufflation to 15mmHg was obtained and gross inspection revealed no evidence of injury from our entry or other intraabdominal abnormalities. There is a sub-centimeter umbilical hernia occupied with preperitoneal fat. Two 5mm trocars were introduced in the right midclavicular and right anterior axillary lines under direct visualization and following infiltration with local. An 11mm trocar was placed in the epigastrium. The gallbladder was retracted cephalad and the infundibulum was retracted laterally. A combination of hook electrocautery and blunt dissection was utilized to clear the peritoneum from the neck and cystic duct, circumferentially isolating the cystic artery and cystic duct and lifting the gallbladder from the cystic plate. The critical view of safety was achieved with the cystic artery, cystic duct, and liver bed visualized between them with no other structures. The artery was clipped with a single clip proximally and distally and divided as was the cystic duct with two clips on the proximal end. The gallbladder was dissected from the liver plate using electrocautery. Once freed the gallbladder was placed in an endocatch bag and removed through the  epigastric trocar site, which had to be extended significantly to permit the large stones within the gallbladder. Some bile but no stones had been spilled from the gallbladder during its dissection from the liver bed. This was aspirated and the right upper quadrant was irrigated copiously until the effluent was clear. Hemostasis was once again confirmed, and reinspection of the abdomen revealed no injuries. The clips were well opposed without any bile leak from the duct or the liver bed. The 11mm trocar site in the epigastrium was closed with a figure-of-eight 0 vicryl in the fascia under direct visualization using a PMI device. The abdomen was desufflated and all trocars removed. The skin incisions were closed with running subcuticular monocryl and Dermabond. The patient was awakened, extubated and transported to the recovery room in stable condition.   All counts were correct at the completion of the case.

## 2016-10-06 ENCOUNTER — Encounter (HOSPITAL_COMMUNITY): Payer: Self-pay | Admitting: Surgery

## 2018-07-07 ENCOUNTER — Other Ambulatory Visit: Payer: Self-pay | Admitting: *Deleted

## 2018-07-07 DIAGNOSIS — Z20822 Contact with and (suspected) exposure to covid-19: Secondary | ICD-10-CM

## 2018-07-09 LAB — NOVEL CORONAVIRUS, NAA: SARS-CoV-2, NAA: NOT DETECTED

## 2018-11-13 ENCOUNTER — Encounter: Payer: Self-pay | Admitting: Family Medicine

## 2018-11-13 ENCOUNTER — Other Ambulatory Visit: Payer: Self-pay

## 2018-11-13 ENCOUNTER — Ambulatory Visit (INDEPENDENT_AMBULATORY_CARE_PROVIDER_SITE_OTHER): Payer: 59 | Admitting: Family Medicine

## 2018-11-13 VITALS — BP 124/82 | HR 88 | Temp 98.5°F | Resp 12 | Ht 63.0 in | Wt 233.4 lb

## 2018-11-13 DIAGNOSIS — R197 Diarrhea, unspecified: Secondary | ICD-10-CM

## 2018-11-13 DIAGNOSIS — K915 Postcholecystectomy syndrome: Secondary | ICD-10-CM

## 2018-11-13 DIAGNOSIS — G4452 New daily persistent headache (NDPH): Secondary | ICD-10-CM

## 2018-11-13 DIAGNOSIS — E282 Polycystic ovarian syndrome: Secondary | ICD-10-CM

## 2018-11-13 DIAGNOSIS — E538 Deficiency of other specified B group vitamins: Secondary | ICD-10-CM

## 2018-11-13 DIAGNOSIS — H469 Unspecified optic neuritis: Secondary | ICD-10-CM

## 2018-11-13 DIAGNOSIS — R519 Headache, unspecified: Secondary | ICD-10-CM | POA: Diagnosis not present

## 2018-11-13 DIAGNOSIS — E669 Obesity, unspecified: Secondary | ICD-10-CM | POA: Insufficient documentation

## 2018-11-13 DIAGNOSIS — Z6841 Body Mass Index (BMI) 40.0 and over, adult: Secondary | ICD-10-CM

## 2018-11-13 LAB — COMPREHENSIVE METABOLIC PANEL
ALT: 41 U/L — ABNORMAL HIGH (ref 0–35)
AST: 26 U/L (ref 0–37)
Albumin: 4.5 g/dL (ref 3.5–5.2)
Alkaline Phosphatase: 54 U/L (ref 39–117)
BUN: 11 mg/dL (ref 6–23)
CO2: 27 mEq/L (ref 19–32)
Calcium: 9.8 mg/dL (ref 8.4–10.5)
Chloride: 105 mEq/L (ref 96–112)
Creatinine, Ser: 0.74 mg/dL (ref 0.40–1.20)
GFR: 90.78 mL/min (ref >60.00)
Glucose, Bld: 86 mg/dL (ref 70–99)
Potassium: 4.9 mEq/L (ref 3.5–5.1)
Sodium: 140 mEq/L (ref 135–145)
Total Bilirubin: 0.3 mg/dL (ref 0.2–1.2)
Total Protein: 7.2 g/dL (ref 6.0–8.3)

## 2018-11-13 LAB — VITAMIN B12: Vitamin B-12: 375 pg/mL (ref 211–911)

## 2018-11-13 MED ORDER — METFORMIN HCL ER 500 MG PO TB24: 500.0000 mg | ORAL_TABLET | Freq: Two times a day (BID) | ORAL | 1 refills | Status: DC

## 2018-11-13 MED ORDER — CHOLESTYRAMINE 4 G PO PACK
4.0000 g | PACK | Freq: Every day | ORAL | 3 refills | Status: DC
Start: 2018-11-13 — End: 2019-01-11

## 2018-11-13 NOTE — Progress Notes (Signed)
HPI:   Patricia.Patricia Weaver is a 33 y.o. female, who is here today to establish care.  Former PCP: Patricia Patricia BarthelWendy Fields, FNP. Last preventive routine visit: 2019. She lives with her husband and 32 year old son.  Chronic medical problems: Obesity,PCOS,chronic diarrhea, and migraines among some.   Concerns today: Temporal headaches, medication refill, and diarrhea.  She was diagnosed with migraines in her early teens, usually she has 2-3 episodes per month.  Her typical migraine is usually temporal, right or left, 4/10,associated photophobia and nausea.  Sometimes she has tingling sensation in right side of her body that lasts a few minutes before headache starts.  For the past 3 to 4 months she has had another type of headache. She has not identified exacerbating or alleviating factors. Headache is dull/achy pain, 3-4/10. This headache is not associated with photophobia, nausea, vomiting, or focal neurologic deficit.  According to patient, during her last eye exam she was told she had a "swollen ophthalmic nerve." She has not had head imaging. For the past few weeks she has noted right eye pain upon movement. Negative for visual abnormalities, conjunctival erythema, or discharge.  She denies fever, chills, night sweats, abnormal weight loss, or unusual fatigue.   PCOS: Currently she is on Metformin XR 500 mg twice daily, which has helped with menstrual regularity. She stopped taking medication for about 3 months due to issues with insurance/mail order, started back with irregular menses. She has tolerated the medication well.  She exercises regularly, walks daily and dance a few times per week. She does not follow a healthful diet consistently.  Diarrhea: daily 3-5 watery, yellowish stools.  She has not noted blood or mucus in the stool. Symptoms seems to be worse in the morning after breakfast, she has 3 episodes back to back and in the afternoon 1-2. Intermittent  generalized abdominal cramps, that resolved after defecation.  Negative for sick contacts or travel around the time she started with diarrhea. Problem has been a stable. She has not tried OTC medication. She has had problem since 2018, when she had cholecystectomy. Reporting negative work up done by former PCP.  Metformin did not improve while she was off of Metformin.  Review of Systems  Constitutional: Negative for activity change, appetite change, fatigue, fever and unexpected weight change.  HENT: Negative for mouth sores, nosebleeds and trouble swallowing.   Eyes: Positive for pain. Negative for redness and visual disturbance.  Respiratory: Negative for cough, shortness of breath and wheezing.   Cardiovascular: Negative for chest pain, palpitations and leg swelling.  Gastrointestinal: Negative for abdominal pain, nausea and vomiting.       Negative for changes in bowel habits.  Genitourinary: Negative for decreased urine volume, difficulty urinating, dysuria and hematuria.  Neurological: Negative for syncope, weakness and numbness.  Psychiatric/Behavioral: Negative.   Rest see pertinent positives and negatives per HPI.   Current Outpatient Medications on File Prior to Visit  Medication Sig Dispense Refill  . Cetirizine HCl (ZYRTEC ALLERGY PO) Take 1 tablet by mouth daily.    Marland Kitchen. ibuprofen (ADVIL,MOTRIN) 200 MG tablet Take 400 mg by mouth daily as needed for headache or moderate pain.     No current facility-administered medications on file prior to visit.      Past Medical History:  Diagnosis Date  . Asthma   . Dysmenorrhea   . GERD (gastroesophageal reflux disease)   . Headache    migraines  . History of chicken pox   .  PCOS (polycystic ovarian syndrome)    No Known Allergies  Family History  Problem Relation Age of Onset  . Hypertension Mother   . Diabetes Mother   . Thyroid disease Mother   . Emphysema Mother   . Asthma Mother   . COPD Mother   .  Hypertension Father   . Stroke Father   . Diabetes Maternal Grandfather     Social History   Socioeconomic History  . Marital status: Married    Spouse name: Not on file  . Number of children: Not on file  . Years of education: 1  . Highest education level: Not on file  Occupational History  . Not on file  Social Needs  . Financial resource strain: Not on file  . Food insecurity    Worry: Not on file    Inability: Not on file  . Transportation needs    Medical: Not on file    Non-medical: Not on file  Tobacco Use  . Smoking status: Never Smoker  . Smokeless tobacco: Never Used  Substance and Sexual Activity  . Alcohol use: No  . Drug use: No  . Sexual activity: Yes    Partners: Male  Lifestyle  . Physical activity    Days per week: Not on file    Minutes per session: Not on file  . Stress: Not on file  Relationships  . Social Musician on phone: Not on file    Gets together: Not on file    Attends religious service: Not on file    Active member of club or organization: Not on file    Attends meetings of clubs or organizations: Not on file    Relationship status: Not on file  Other Topics Concern  . Not on file  Social History Narrative  . Not on file    Vitals:   11/13/18 1142  BP: 124/82  Pulse: 88  Resp: 12  Temp: 98.5 F (36.9 C)  SpO2: 99%    Body mass index is 41.34 kg/m.   Physical Exam  Nursing note and vitals reviewed. Constitutional: She is oriented to person, place, and time. She appears well-developed. No distress.  HENT:  Head: Normocephalic and atraumatic.  Mouth/Throat: Oropharynx is clear and moist and mucous membranes are normal.  Eyes: Pupils are equal, round, and reactive to light. Conjunctivae are normal.  Cardiovascular: Normal rate and regular rhythm.  No murmur heard. Pulses:      Dorsalis pedis pulses are 2+ on the right side and 2+ on the left side.  Respiratory: Effort normal and breath sounds normal. No  respiratory distress.  GI: Soft. She exhibits no mass. There is no hepatomegaly. There is no abdominal tenderness.  Musculoskeletal:        General: No edema.  Lymphadenopathy:    She has no cervical adenopathy.  Neurological: She is alert and oriented to person, place, and time. She has normal strength. No cranial nerve deficit. Gait normal.  Skin: Skin is warm. No rash noted. No erythema.  Psychiatric: She has a normal mood and affect.  Well groomed, good eye contact.    ASSESSMENT AND PLAN:  Patricia.Merlene was seen today for establish care.  Diagnoses and all orders for this visit:  Optic neuritis, right Per pt report. She was instructed to continue following annually. Because new onset headache I think it is appropriate to have brain imaging done. Instructed about warning sings.  Right temporal headache -  MR Brain W Wo Contrast; Future  B12 deficiency No changes in current management, will follow labs done today and will give further recommendations accordingly.  -     Vitamin B12  Post-cholecystectomy syndrome Most likely this is contributing to chronic diarrhea. She would like to start treatment. Avid greasy food. Questran started.  -     cholestyramine (QUESTRAN) 4 g packet; Take 1 packet (4 g total) by mouth daily.  New daily persistent headache Different to her usual migraine +optic neuritis. Brain imaging will be arranged. Instructed about warning signs.  -     MR Brain W Wo Contrast; Future  Diarrhea, unspecified type Possible etiologies discussed. ? IBS, medication,and s/o cholecystectomy among some.  -     Comprehensive metabolic panel  PCOS (polycystic ovarian syndrome) Problem seems to be well controlled with current management. Continue metformin XR, side effects discussed.  -     metFORMIN (GLUCOPHAGE-XR) 500 MG 24 hr tablet; Take 1 tablet (500 mg total) by mouth 2 (two) times daily with a meal. 2 tablets (1000 mg) by mouth daily   Morbid  obesity with BMI of 40.0-44.9, adult (HCC) We discussed benefits of wt loss as well as adverse effects of obesity. Consistency with healthy diet and physical activity recommended.   Return in about 3 months (around 02/13/2019).    Betty G. Martinique, MD  [facility] [place] office.

## 2018-11-13 NOTE — Patient Instructions (Signed)
A few things to remember from today's visit:   Optic neuritis, right  Right temporal headache  B12 deficiency  Post-cholecystectomy syndrome - Plan: cholestyramine (QUESTRAN) 4 g packet  New daily persistent headache - Plan: MR Brain W Wo Contrast   Please be sure medication list is accurate. If a new problem present, please set up appointment sooner than planned today.

## 2018-11-15 DIAGNOSIS — E538 Deficiency of other specified B group vitamins: Secondary | ICD-10-CM | POA: Insufficient documentation

## 2018-11-22 ENCOUNTER — Telehealth: Payer: Self-pay

## 2018-11-22 NOTE — Telephone Encounter (Signed)
Pt. Called back, given lab results and instructions. Verbalizes understanding. 

## 2018-11-27 ENCOUNTER — Other Ambulatory Visit: Payer: Self-pay

## 2018-11-27 ENCOUNTER — Ambulatory Visit
Admission: RE | Admit: 2018-11-27 | Discharge: 2018-11-27 | Disposition: A | Discharge status: Home / Self Care | Payer: 59 | Source: Ambulatory Visit | Attending: Family Medicine | Admitting: Family Medicine

## 2018-11-27 DIAGNOSIS — G4452 New daily persistent headache (NDPH): Secondary | ICD-10-CM

## 2018-11-27 DIAGNOSIS — R519 Headache, unspecified: Secondary | ICD-10-CM

## 2018-11-27 MED ORDER — GADOBENATE DIMEGLUMINE 529 MG/ML IV SOLN
20.0000 mL | Freq: Once | INTRAVENOUS | Status: AC | PRN
  Administered 2018-11-27: 20 mL via INTRAVENOUS

## 2019-01-11 ENCOUNTER — Other Ambulatory Visit: Payer: Self-pay

## 2019-01-11 DIAGNOSIS — K915 Postcholecystectomy syndrome: Secondary | ICD-10-CM

## 2019-01-11 MED ORDER — CHOLESTYRAMINE 4 G PO PACK: 4.0000 g | PACK | Freq: Every day | ORAL | 3 refills | Status: DC

## 2019-07-17 ENCOUNTER — Telehealth: Payer: Self-pay | Admitting: Family Medicine

## 2019-07-17 ENCOUNTER — Other Ambulatory Visit: Payer: Self-pay | Admitting: *Deleted

## 2019-07-17 DIAGNOSIS — E282 Polycystic ovarian syndrome: Secondary | ICD-10-CM

## 2019-07-17 MED ORDER — METFORMIN HCL ER 500 MG PO TB24: 500.0000 mg | ORAL_TABLET | Freq: Two times a day (BID) | ORAL | 1 refills | Status: DC

## 2019-07-17 NOTE — Telephone Encounter (Signed)
Rx sent to pharmacy   

## 2019-07-17 NOTE — Telephone Encounter (Signed)
Pt has an appt to see PCP on 6/25 but she only has 2 pills left of this medication.   Medication Refill: Metformin   Pharmacy: [facility] FAX: [phone]

## 2019-07-24 ENCOUNTER — Other Ambulatory Visit: Payer: Self-pay | Admitting: Family Medicine

## 2019-07-24 DIAGNOSIS — K915 Postcholecystectomy syndrome: Secondary | ICD-10-CM

## 2019-07-26 ENCOUNTER — Other Ambulatory Visit: Payer: Self-pay

## 2019-07-26 ENCOUNTER — Encounter: Payer: Self-pay | Admitting: Family Medicine

## 2019-07-26 ENCOUNTER — Ambulatory Visit (INDEPENDENT_AMBULATORY_CARE_PROVIDER_SITE_OTHER): Payer: 59 | Admitting: Family Medicine

## 2019-07-26 VITALS — BP 124/86 | HR 80 | Temp 98.0°F | Resp 12 | Ht 63.0 in | Wt 238.2 lb

## 2019-07-26 DIAGNOSIS — Z13228 Encounter for screening for other metabolic disorders: Secondary | ICD-10-CM

## 2019-07-26 DIAGNOSIS — Z1329 Encounter for screening for other suspected endocrine disorder: Secondary | ICD-10-CM

## 2019-07-26 DIAGNOSIS — Z0000 Encounter for general adult medical examination without abnormal findings: Secondary | ICD-10-CM | POA: Diagnosis not present

## 2019-07-26 DIAGNOSIS — Z6841 Body Mass Index (BMI) 40.0 and over, adult: Secondary | ICD-10-CM

## 2019-07-26 DIAGNOSIS — K915 Postcholecystectomy syndrome: Secondary | ICD-10-CM

## 2019-07-26 DIAGNOSIS — E282 Polycystic ovarian syndrome: Secondary | ICD-10-CM

## 2019-07-26 DIAGNOSIS — Z13220 Encounter for screening for lipoid disorders: Secondary | ICD-10-CM

## 2019-07-26 DIAGNOSIS — Z1159 Encounter for screening for other viral diseases: Secondary | ICD-10-CM

## 2019-07-26 DIAGNOSIS — Z130 Encounter for screening for diseases of the blood and blood-forming organs and certain disorders involving the immune mechanism: Secondary | ICD-10-CM

## 2019-07-26 NOTE — Patient Instructions (Addendum)
Today you have you routine preventive visit. A few things to remember from today's visit:  Routine general medical examination at a health care facility  Encounter for HCV screening test for low risk patient - Plan: Hepatitis C antibody screen  Screening for lipoid disorders - Plan: Lipid panel  Screening for endocrine, metabolic and immunity disorder - Plan: Basic metabolic panel, Hemoglobin A1c  No changes today. Provera is an option for regulate your menstrual periods. Please arrange lab appt.  If you need refills please call your pharmacy. Do not use My Chart to request refills or for acute issues that need immediate attention.    Please be sure medication list is accurate. If a new problem present, please set up appointment sooner than planned today.  At least 150 minutes of moderate exercise per week, daily brisk walking for 15-30 min is a good exercise option. Healthy diet low in saturated (animal) fats and sweets and consisting of fresh fruits and vegetables, lean meats such as fish and white chicken and whole grains.  These are some of recommendations for screening depending of age and risk factors:  - Vaccines:  Tdap vaccine every 10 years.  Shingles vaccine recommended at age 64, could be given after 33 years of age but not sure about insurance coverage.   Pneumonia vaccines: Pneumovax at 65. Sometimes Pneumovax is giving earlier if history of smoking, lung disease,diabetes,kidney disease among some.  Screening for diabetes at age 74 and every 3 years.  Cervical cancer prevention:  Pap smear starts at 33 years of age and continues periodically until 33 years old in low risk women. Pap smear every 3 years between 74 and 68 years old. Pap smear every 3-5 years between women 30 and older if pap smear negative and HPV screening negative.   -Breast cancer: Mammogram: There is disagreement between experts about when to start screening in low risk asymptomatic female  but recent recommendations are to start screening at 1 and not later than 33 years old , every 1-2 years and after 33 yo q 2 years. Screening is recommended until 33 years old but some women can continue screening depending of healthy issues.  Colon cancer screening: Has been recently changed to 33 yo. Insurance may not cover until you are 33 years old. Screening is recommended until 33 years old.  Cholesterol disorder screening at age 66 and every 3 years.  Also recommended:  1. Dental visit- Brush and floss your teeth twice daily; visit your dentist twice a year. 2. Eye doctor- Get an eye exam at least every 2 years. 3. Helmet use- Always wear a helmet when riding a bicycle, motorcycle, rollerblading or skateboarding. 4. Safe sex- If you may be exposed to sexually transmitted infections, use a condom. 5. Seat belts- Seat belts can save your live; always wear one. 6. Smoke/Carbon Monoxide detectors- These detectors need to be installed on the appropriate level of your home. Replace batteries at least once a year. 7. Skin cancer- When out in the sun please cover up and use sunscreen 15 SPF or higher. 8. Violence- If anyone is threatening or hurting you, please tell your healthcare provider.  9. Drink alcohol in moderation- Limit alcohol intake to one drink or less per day. Never drink and drive. 10. Calcium supplementation 1000 to 1200 mg daily, ideally through your diet.  Vitamin D supplementation 800 units daily.

## 2019-07-26 NOTE — Progress Notes (Signed)
HPI: Patricia Weaver is a 34 y.o. female, who is here today for her routine physical.  Last CPE: 2019.  Regular exercise 3 or more time per week: Not consistently Following a healthy diet: She has not been consistent but she acknowledges she needs to do better.She has gained some wt since her last visit (11/13/18). She lives with her husband and 53 year old son.  Chronic medical problems: PCOS, chronic diarrhea, and migraines among some.  Pap smear: 2-3 years ago. Hx of abnormal pap smears: Negative. Birth control: Husband has vasectomy. M:11 G:1 L:1 LMP: 07/18/19.   There is no immunization history on file for this patient.  Mammogram: N/A  Hep C screening: Never.  She has no new concerns today.  PCOS: She is on Metformin 500 mg bid. Concerned because she is still having irregular menses, oligomenorrhea. Concerned about developing diabetes.  Chronic diarrhea/post cholecystectomy: Greatly improved with Questran 4 g daily. Before starting medication,she was having 3-5 watery stools+cramps. She is having one "normal" stool daily.  Review of Systems  Constitutional: Negative for appetite change, fatigue and fever.  HENT: Negative for dental problem, hearing loss, mouth sores and sore throat.   Eyes: Negative for redness and visual disturbance.  Respiratory: Negative for cough, shortness of breath and wheezing.   Cardiovascular: Negative for chest pain and leg swelling.  Gastrointestinal: Negative for abdominal pain, nausea and vomiting.  Endocrine: Negative for cold intolerance, heat intolerance, polydipsia, polyphagia and polyuria.  Genitourinary: Positive for menstrual problem. Negative for decreased urine volume, dysuria, hematuria, vaginal bleeding and vaginal discharge.  Musculoskeletal: Negative for gait problem and myalgias.  Skin: Negative for color change and rash.  Allergic/Immunologic: Positive for environmental allergies.  Neurological: Negative for  syncope, weakness and headaches.  Hematological: Negative for adenopathy. Does not bruise/bleed easily.  Psychiatric/Behavioral: Negative for confusion. The patient is not nervous/anxious.   All other systems reviewed and are negative.   Current Outpatient Medications on File Prior to Visit  Medication Sig Dispense Refill  . Cetirizine HCl (ZYRTEC ALLERGY PO) Take 1 tablet by mouth daily.    Marland Kitchen ibuprofen (ADVIL,MOTRIN) 200 MG tablet Take 400 mg by mouth daily as needed for headache or moderate pain.    . metFORMIN (GLUCOPHAGE-XR) 500 MG 24 hr tablet Take 1 tablet (500 mg total) by mouth 2 (two) times daily with a meal. 2 tablets (1000 mg) by mouth daily 180 tablet 1  . cholestyramine (QUESTRAN) 4 g packet MIX AND DRINK 1 PACKET DAILY 90 packet 3   No current facility-administered medications on file prior to visit.   Past Medical History:  Diagnosis Date  . Asthma   . Dysmenorrhea   . GERD (gastroesophageal reflux disease)   . Headache    migraines  . History of chicken pox   . PCOS (polycystic ovarian syndrome)     Past Surgical History:  Procedure Laterality Date  . CHOLECYSTECTOMY N/A 10/05/2016   Procedure: LAPAROSCOPIC CHOLECYSTECTOMY;  Surgeon: Berna Bue, MD;  Location: [facility] OR;  Service: General;  Laterality: N/A;  . WISDOM TOOTH EXTRACTION     No Known Allergies  Family History  Problem Relation Age of Onset  . Hypertension Mother   . Diabetes Mother   . Thyroid disease Mother   . Emphysema Mother   . Asthma Mother   . COPD Mother   . Hypertension Father   . Stroke Father   . Diabetes Maternal Grandfather     Social History   Socioeconomic  History  . Marital status: Married    Spouse name: Not on file  . Number of children: Not on file  . Years of education: 1  . Highest education level: Not on file  Occupational History  . Not on file  Tobacco Use  . Smoking status: Never Smoker  . Smokeless tobacco: Never Used  Vaping Use  . Vaping Use: Never  used  Substance and Sexual Activity  . Alcohol use: No  . Drug use: No  . Sexual activity: Yes    Partners: Male  Other Topics Concern  . Not on file  Social History Narrative  . Not on file   Social Determinants of Health   Financial Resource Strain:   . Difficulty of Paying Living Expenses:   Food Insecurity:   . Worried About Programme researcher, broadcasting/film/video in the Last Year:   . Barista in the Last Year:   Transportation Needs:   . Freight forwarder (Medical):   Marland Kitchen Lack of Transportation (Non-Medical):   Physical Activity:   . Days of Exercise per Week:   . Minutes of Exercise per Session:   Stress:   . Feeling of Stress :   Social Connections:   . Frequency of Communication with Friends and Family:   . Frequency of Social Gatherings with Friends and Family:   . Attends Religious Services:   . Active Member of Clubs or Organizations:   . Attends Banker Meetings:   Marland Kitchen Marital Status:    Vitals:   07/26/19 1034  BP: 124/86  Pulse: 80  Resp: 12  Temp: 98 F (36.7 C)  SpO2: 99%   Body mass index is 42.2 kg/m.  Wt Readings from Last 3 Encounters:  07/26/19 238 lb 4 oz (108.1 kg)  11/13/18 233 lb 6 oz (105.9 kg)  10/05/16 200 lb (90.7 kg)   Physical Exam Vitals and nursing note reviewed.  Constitutional:      General: She is not in acute distress.    Appearance: She is well-developed. She is obese.  HENT:     Head: Atraumatic.     Right Ear: Tympanic membrane, ear canal and external ear normal.     Left Ear: Tympanic membrane, ear canal and external ear normal.     Mouth/Throat:     Mouth: Mucous membranes are moist.     Pharynx: Oropharynx is clear. Uvula midline.  Eyes:     General: Lids are normal.     Extraocular Movements: Extraocular movements intact.     Conjunctiva/sclera: Conjunctivae normal.     Pupils: Pupils are equal, round, and reactive to light.  Neck:     Thyroid: No thyromegaly.  Cardiovascular:     Rate and Rhythm:  Normal rate and regular rhythm.     Pulses:          Radial pulses are 2+ on the right side and 2+ on the left side.       Dorsalis pedis pulses are 2+ on the right side and 2+ on the left side.     Heart sounds: S1 normal. No murmur heard.   Pulmonary:     Effort: Pulmonary effort is normal. No respiratory distress.     Breath sounds: Normal breath sounds. No wheezing or rales.  Abdominal:     Palpations: Abdomen is soft. There is no mass.     Tenderness: There is no abdominal tenderness.  Genitourinary:    Comments: Differed to gynecologists.  Musculoskeletal:        General: No tenderness.     Comments: No signs of synovitis.  Lymphadenopathy:     Cervical: No cervical adenopathy.     Upper Body:     Right upper body: No supraclavicular adenopathy.     Left upper body: No supraclavicular adenopathy.  Skin:    General: Skin is warm.     Findings: No erythema or rash.  Neurological:     Mental Status: She is alert and oriented to person, place, and time.     Cranial Nerves: No cranial nerve deficit.     Sensory: No sensory deficit.     Coordination: Coordination normal.     Gait: Gait normal.     Deep Tendon Reflexes:     Reflex Scores:      Bicep reflexes are 2+ on the right side and 2+ on the left side.      Patellar reflexes are 2+ on the right side and 2+ on the left side. Psychiatric:        Mood and Affect: Mood and affect normal.        Speech: Speech normal.     Comments: Well groomed and good eye contact.    ASSESSMENT AND PLAN:  Patricia Weaver was here today annual physical examination.  Orders Placed This Encounter  Procedures  . Basic metabolic panel  . Hemoglobin A1c  . Lipid panel  . Hepatitis C antibody screen    Routine general medical examination at a health care facility We discussed the importance of regular physical activity and healthy diet for prevention of chronic illness and/or complications. Preventive guidelines  reviewed. Vaccination up to date.  Next CPE in a year.  Encounter for HCV screening test for low risk patient -     Hepatitis C antibody screen; Future  Screening for lipoid disorders -     Lipid panel; Future  Screening for endocrine, metabolic and immunity disorder -     Basic metabolic panel; Future -     Hemoglobin A1c; Future  Morbid obesity with BMI of 40.0-44.9, adult [facility]) She has gained about 5 Lb since 11/2018. We discussed benefits of wt loss as well as adverse effects of obesity. Consistency with healthy diet and physical activity recommended.  PCOS (polycystic ovarian syndrome) We discussed dx,prognosis,and treatment options. She would like to hold on hormonal therapy, Provera monthly x 10 days. Continue Metformin 500 mg bid.  Post-cholecystectomy syndrome Great improvement. Continue Questran 4 g daily.   Return in 1 year (on 07/25/2020) for cpe and f/u.   Jossiah Smoak G. [place], MD  [facility]. [place] office.   Today you have you routine preventive visit. A few things to remember from today's visit:  Routine general medical examination at a health care facility  Encounter for HCV screening test for low risk patient - Plan: Hepatitis C antibody screen  Screening for lipoid disorders - Plan: Lipid panel  Screening for endocrine, metabolic and immunity disorder - Plan: Basic metabolic panel, Hemoglobin A1c  No changes today. Provera is an option for regulate your menstrual periods. Please arrange lab appt.  If you need refills please call your pharmacy. Do not use My Chart to request refills or for acute issues that need immediate attention.    Please be sure medication list is accurate. If a new problem present, please set up appointment sooner than planned today.  At least 150 minutes of moderate exercise per week, daily brisk  walking for 15-30 min is a good exercise option. Healthy diet low in saturated (animal) fats and sweets and  consisting of fresh fruits and vegetables, lean meats such as fish and white chicken and whole grains.  These are some of recommendations for screening depending of age and risk factors:  - Vaccines:  Tdap vaccine every 10 years.  Shingles vaccine recommended at age 73, could be given after 33 years of age but not sure about insurance coverage.   Pneumonia vaccines: Pneumovax at 65. Sometimes Pneumovax is giving earlier if history of smoking, lung disease,diabetes,kidney disease among some.  Screening for diabetes at age 22 and every 3 years.  Cervical cancer prevention:  Pap smear starts at 33 years of age and continues periodically until 33 years old in low risk women. Pap smear every 3 years between 24 and 50 years old. Pap smear every 3-5 years between women 30 and older if pap smear negative and HPV screening negative.   -Breast cancer: Mammogram: There is disagreement between experts about when to start screening in low risk asymptomatic female but recent recommendations are to start screening at 33 and not later than 33 years old , every 1-2 years and after 33 yo q 2 years. Screening is recommended until 33 years old but some women can continue screening depending of healthy issues.  Colon cancer screening: Has been recently changed to 33 yo. Insurance may not cover until you are 33 years old. Screening is recommended until 34 years old.  Cholesterol disorder screening at age 66 and every 3 years.  Also recommended:  1. Dental visit- Brush and floss your teeth twice daily; visit your dentist twice a year. 2. Eye doctor- Get an eye exam at least every 2 years. 3. Helmet use- Always wear a helmet when riding a bicycle, motorcycle, rollerblading or skateboarding. 4. Safe sex- If you may be exposed to sexually transmitted infections, use a condom. 5. Seat belts- Seat belts can save your live; always wear one. 6. Smoke/Carbon Monoxide detectors- These detectors need to be installed  on the appropriate level of your home. Replace batteries at least once a year. 7. Skin cancer- When out in the sun please cover up and use sunscreen 15 SPF or higher. 8. Violence- If anyone is threatening or hurting you, please tell your healthcare provider.  9. Drink alcohol in moderation- Limit alcohol intake to one drink or less per day. Never drink and drive. 10. Calcium supplementation 1000 to 1200 mg daily, ideally through your diet.  Vitamin D supplementation 800 units daily.

## 2019-07-28 ENCOUNTER — Encounter: Payer: Self-pay | Admitting: Family Medicine

## 2019-07-29 ENCOUNTER — Other Ambulatory Visit: Payer: Self-pay

## 2019-07-29 ENCOUNTER — Other Ambulatory Visit (INDEPENDENT_AMBULATORY_CARE_PROVIDER_SITE_OTHER): Payer: 59

## 2019-07-29 DIAGNOSIS — Z13220 Encounter for screening for lipoid disorders: Secondary | ICD-10-CM

## 2019-07-29 DIAGNOSIS — Z13228 Encounter for screening for other metabolic disorders: Secondary | ICD-10-CM | POA: Diagnosis not present

## 2019-07-29 DIAGNOSIS — Z1329 Encounter for screening for other suspected endocrine disorder: Secondary | ICD-10-CM

## 2019-07-29 DIAGNOSIS — Z130 Encounter for screening for diseases of the blood and blood-forming organs and certain disorders involving the immune mechanism: Secondary | ICD-10-CM | POA: Diagnosis not present

## 2019-07-29 DIAGNOSIS — Z1159 Encounter for screening for other viral diseases: Secondary | ICD-10-CM

## 2019-07-29 LAB — LIPID PANEL
Cholesterol: 180 mg/dL (ref 0–200)
HDL: 34.1 mg/dL — ABNORMAL LOW (ref >39.00)
NonHDL: 145.43
Total CHOL/HDL Ratio: 5
Triglycerides: 266 mg/dL — ABNORMAL HIGH (ref 0.0–149.0)
VLDL: 53.2 mg/dL — ABNORMAL HIGH (ref 0.0–40.0)

## 2019-07-29 LAB — BASIC METABOLIC PANEL
BUN: 10 mg/dL (ref 6–23)
CO2: 27 mEq/L (ref 19–32)
Calcium: 9.6 mg/dL (ref 8.4–10.5)
Chloride: 101 mEq/L (ref 96–112)
Creatinine, Ser: 0.78 mg/dL (ref 0.40–1.20)
GFR: 85.05 mL/min (ref >60.00)
Glucose, Bld: 99 mg/dL (ref 70–99)
Potassium: 4.2 mEq/L (ref 3.5–5.1)
Sodium: 138 mEq/L (ref 135–145)

## 2019-07-29 LAB — HEMOGLOBIN A1C: Hgb A1c MFr Bld: 5.8 % (ref 4.6–6.5)

## 2019-07-29 LAB — LDL CHOLESTEROL, DIRECT: Direct LDL: 110 mg/dL

## 2019-07-30 LAB — HEPATITIS C ANTIBODY
Hepatitis C Ab: NONREACTIVE
SIGNAL TO CUT-OFF: 0.01 (ref <1.00)

## 2019-10-30 ENCOUNTER — Other Ambulatory Visit: Payer: 59

## 2019-10-30 DIAGNOSIS — Z20822 Contact with and (suspected) exposure to covid-19: Secondary | ICD-10-CM

## 2019-10-31 LAB — SARS-COV-2, NAA 2 DAY TAT

## 2019-10-31 LAB — NOVEL CORONAVIRUS, NAA: SARS-CoV-2, NAA: NOT DETECTED

## 2019-12-17 ENCOUNTER — Ambulatory Visit: Payer: 59 | Attending: Internal Medicine

## 2019-12-17 DIAGNOSIS — Z23 Encounter for immunization: Secondary | ICD-10-CM

## 2019-12-17 NOTE — Progress Notes (Signed)
   Covid-19 Vaccination Clinic  Name:  Patricia Weaver    MRN: [id number] DOB: [birth year]  12/17/2019  [name] was observed post Covid-19 immunization for 15 minutes without incident. She was provided with Vaccine Information Sheet and instruction to access the V-Safe system.   [name] was instructed to call 911 with any severe reactions post vaccine: Marland Kitchen Difficulty breathing  . Swelling of face and throat  . A fast heartbeat  . A bad rash all over body  . Dizziness and weakness   Immunizations Administered    No immunizations on file.

## 2020-02-04 ENCOUNTER — Other Ambulatory Visit: Payer: Self-pay | Admitting: Family Medicine

## 2020-02-04 DIAGNOSIS — E282 Polycystic ovarian syndrome: Secondary | ICD-10-CM

## 2020-02-25 ENCOUNTER — Ambulatory Visit (INDEPENDENT_AMBULATORY_CARE_PROVIDER_SITE_OTHER): Payer: BC Managed Care – PPO | Admitting: Family Medicine

## 2020-02-25 ENCOUNTER — Other Ambulatory Visit: Payer: Self-pay

## 2020-02-25 ENCOUNTER — Encounter: Payer: Self-pay | Admitting: Family Medicine

## 2020-02-25 VITALS — BP 130/80 | HR 103 | Temp 97.6°F | Resp 12 | Ht 63.0 in | Wt 236.0 lb

## 2020-02-25 DIAGNOSIS — E785 Hyperlipidemia, unspecified: Secondary | ICD-10-CM | POA: Diagnosis not present

## 2020-02-25 DIAGNOSIS — K915 Postcholecystectomy syndrome: Secondary | ICD-10-CM

## 2020-02-25 DIAGNOSIS — E282 Polycystic ovarian syndrome: Secondary | ICD-10-CM | POA: Diagnosis not present

## 2020-02-25 DIAGNOSIS — R7303 Prediabetes: Secondary | ICD-10-CM

## 2020-02-25 DIAGNOSIS — Z6841 Body Mass Index (BMI) 40.0 and over, adult: Secondary | ICD-10-CM

## 2020-02-25 DIAGNOSIS — E538 Deficiency of other specified B group vitamins: Secondary | ICD-10-CM

## 2020-02-25 MED ORDER — METFORMIN HCL ER 500 MG PO TB24: ORAL_TABLET | ORAL | 0 refills | Status: DC

## 2020-02-25 NOTE — Patient Instructions (Signed)
A few things to remember from today's visit:  If you need refills please call your pharmacy. Do not use My Chart to request refills or for acute issues that need immediate attention.    No changes now but we can decide about Metformin after labs. Fasting labs this week.   Please be sure medication list is accurate. If a new problem present, please set up appointment sooner than planned today.

## 2020-02-25 NOTE — Progress Notes (Signed)
HPI: Ms.Moneka RHYLYNN PERDOMO is a 35 y.o. female, who is here today for follow up.   She was last seen on 07/26/19 No new problems since her last visit.  -PCOS: LMP: 30 days ago.  She is on Metformin XR 500 mg bid. She has tolerated medication well.  She has been on Metformin for 10 years. She started a new vit supplement around 6-08/2019 and has helped with menstrual regularity.She does not think metformin alone was helping. -Metformin has not helped with wt loss.  She is exercising regularly if the weather permits it. She has lost about 10 Lb earlier last year. Not consistent with following a healthful diet. HgA1C 5.8 on 07/29/19.  Negative for polydipsia,polyuria, or polyphagia.  -Chronic diarrhea: S/P cholecystectomy in 2018. Cholestyramine 4 g pkt, 1/2 of dose has helped. She has tolerated medication well. Before she started medication she was having 3-5 watery stools and abdominal cramps.  Negative abdominal pain, nausea, vomiting, changes in bowel habits, blood in stool or melena.  -B12 deficiency: She is on B12 supplementation. Last B12 11/23/18 was 375  -HLD: She is on non pharmacologic treatment.  Component     Latest Ref Rng & Units 07/29/2019  Cholesterol     0 - 200 mg/dL 440  Triglycerides     0.0 - 149.0 mg/dL 347.4 (H)  HDL Cholesterol     >39.00 mg/dL 25.95 (L)  VLDL     0.0 - 40.0 mg/dL 63.8 (H)  Total CHOL/HDL Ratio      5  NonHDL      145.43   Review of Systems  Constitutional: Negative for activity change, appetite change, fatigue and fever.  HENT: Negative for mouth sores, nosebleeds, sore throat and trouble swallowing.   Respiratory: Negative for cough, shortness of breath and wheezing.   Cardiovascular: Negative for chest pain, palpitations and leg swelling.  Neurological: Negative for syncope, weakness and headaches.  Rest of ROS, see pertinent positives sand negatives in HPI  Current Outpatient Medications on File Prior to Visit   Medication Sig Dispense Refill  . Cetirizine HCl (ZYRTEC ALLERGY PO) Take 1 tablet by mouth daily.    Marland Kitchen ibuprofen (ADVIL,MOTRIN) 200 MG tablet Take 400 mg by mouth daily as needed for headache or moderate pain.     No current facility-administered medications on file prior to visit.    Past Medical History:  Diagnosis Date  . Asthma   . Dysmenorrhea   . GERD (gastroesophageal reflux disease)   . Headache    migraines  . History of chicken pox   . PCOS (polycystic ovarian syndrome)    No Known Allergies  Social History   Socioeconomic History  . Marital status: Married    Spouse name: Not on file  . Number of children: Not on file  . Years of education: 1  . Highest education level: Not on file  Occupational History  . Not on file  Tobacco Use  . Smoking status: Never Smoker  . Smokeless tobacco: Never Used  Vaping Use  . Vaping Use: Never used  Substance and Sexual Activity  . Alcohol use: No  . Drug use: No  . Sexual activity: Yes    Partners: Male  Other Topics Concern  . Not on file  Social History Narrative  . Not on file   Social Determinants of Health   Financial Resource Strain: Not on file  Food Insecurity: Not on file  Transportation Needs: Not on file  Physical  Activity: Not on file  Stress: Not on file  Social Connections: Not on file    Vitals:   02/25/20 1519  BP: 130/80  Pulse: (!) 103  Resp: 12  Temp: 97.6 F (36.4 C)  SpO2: 100%   Wt Readings from Last 3 Encounters:  02/25/20 236 lb (107 kg)  07/26/19 238 lb 4 oz (108.1 kg)  11/13/18 233 lb 6 oz (105.9 kg)   Body mass index is 41.81 kg/m.  Physical Exam Vitals and nursing note reviewed.  Constitutional:      General: She is not in acute distress.    Appearance: She is well-developed.  HENT:     Head: Normocephalic and atraumatic.     Mouth/Throat:     Mouth: Oropharynx is clear and moist and mucous membranes are normal.  Eyes:     Conjunctiva/sclera: Conjunctivae  normal.     Pupils: Pupils are equal, round, and reactive to light.  Cardiovascular:     Rate and Rhythm: Normal rate and regular rhythm.     Heart sounds: No murmur heard.     Comments: HR: 96/min Pulmonary:     Effort: Pulmonary effort is normal. No respiratory distress.     Breath sounds: Normal breath sounds.  Abdominal:     Palpations: Abdomen is soft. There is no hepatomegaly or mass.     Tenderness: There is no abdominal tenderness.  Musculoskeletal:        General: No edema.  Lymphadenopathy:     Cervical: No cervical adenopathy.  Skin:    General: Skin is warm.     Findings: No erythema or rash.  Neurological:     Mental Status: She is alert and oriented to person, place, and time.     Cranial Nerves: No cranial nerve deficit.     Gait: Gait normal.     Deep Tendon Reflexes: Strength normal.  Psychiatric:        Mood and Affect: Mood and affect normal.     Comments: Well groomed, good eye contact.   ASSESSMENT AND PLAN:  Ms. DEALVA LAFOY was seen today for follow-up.  Orders Placed This Encounter  Procedures  . Basic metabolic panel  . Hemoglobin A1c  . Lipid panel  . Insulin, Free (Bioactive)  . Vitamin B12   Lab Results  Component Value Date   VITAMINB12 980 (H) 02/26/2020   Lab Results  Component Value Date   HGBA1C 5.8 02/26/2020    Lab Results  Component Value Date   CHOL 184 02/26/2020   HDL 39.30 02/26/2020   LDLCALC 113 (H) 02/26/2020   LDLDIRECT 110.0 07/29/2019   TRIG 159.0 (H) 02/26/2020   CHOLHDL 5 02/26/2020   Lab Results  Component Value Date   CREATININE 0.71 02/26/2020   BUN 12 02/26/2020   NA 139 02/26/2020   K 4.1 02/26/2020   CL 104 02/26/2020   CO2 27 02/26/2020   Prediabetes Healthy life style for primary prevention encouraged. Continue Metformin XR 500 mg bid. Depending on insulin level and HgA1C we may decide to stop medication and repeat labs in 3-4 months.  Post-cholecystectomy syndrome Problem is well  controlled. Continue Cholestyramine 2 g daily.  PCOS (polycystic ovarian syndrome) Menses are more regular for the past few month. Continue Metformin XR 500 mg bid and OTC vit supplementation.  -     metFORMIN (GLUCOPHAGE-XR) 500 MG 24 hr tablet; TAKE 1 TABLET BY MOUTH TWICE DAILY WITH A MEAL.  Hyperlipidemia, unspecified hyperlipidemia type Continue  non pharmacologic treatment. Further recommendations after FLP result is back. She is coming tomorrow for fasting labs.  B12 deficiency No changes in B12 supplementation dose, will adjust according to B12 result.  Morbid obesity with BMI of 40.0-44.9, adult (HCC) Wt has been stable since her last visit. She understands the benefits of wt loss as well as adverse effects of obesity. Consistency with healthy diet and physical activity recommended.  Return Depending on lab results, for Fasting labs this week.  Aaron Bostwick G. [place], MD  [facility]. [place] office.   A few things to remember from today's visit:  If you need refills please call your pharmacy. Do not use My Chart to request refills or for acute issues that need immediate attention.    No changes now but we can decide about Metformin after labs. Fasting labs this week.   Please be sure medication list is accurate. If a new problem present, please set up appointment sooner than planned today.

## 2020-02-26 ENCOUNTER — Other Ambulatory Visit (INDEPENDENT_AMBULATORY_CARE_PROVIDER_SITE_OTHER): Payer: BC Managed Care – PPO

## 2020-02-26 DIAGNOSIS — E785 Hyperlipidemia, unspecified: Secondary | ICD-10-CM | POA: Diagnosis not present

## 2020-02-26 DIAGNOSIS — E538 Deficiency of other specified B group vitamins: Secondary | ICD-10-CM

## 2020-02-26 DIAGNOSIS — E282 Polycystic ovarian syndrome: Secondary | ICD-10-CM

## 2020-02-26 DIAGNOSIS — R7303 Prediabetes: Secondary | ICD-10-CM | POA: Diagnosis not present

## 2020-02-26 LAB — LIPID PANEL
Cholesterol: 184 mg/dL (ref 0–200)
HDL: 39.3 mg/dL (ref >39.00)
LDL Cholesterol: 113 mg/dL — ABNORMAL HIGH (ref 0–99)
NonHDL: 144.95
Total CHOL/HDL Ratio: 5
Triglycerides: 159 mg/dL — ABNORMAL HIGH (ref 0.0–149.0)
VLDL: 31.8 mg/dL (ref 0.0–40.0)

## 2020-02-26 LAB — BASIC METABOLIC PANEL
BUN: 12 mg/dL (ref 6–23)
CO2: 27 mEq/L (ref 19–32)
Calcium: 9.5 mg/dL (ref 8.4–10.5)
Chloride: 104 mEq/L (ref 96–112)
Creatinine, Ser: 0.71 mg/dL (ref 0.40–1.20)
GFR: 111.58 mL/min (ref >60.00)
Glucose, Bld: 105 mg/dL — ABNORMAL HIGH (ref 70–99)
Potassium: 4.1 mEq/L (ref 3.5–5.1)
Sodium: 139 mEq/L (ref 135–145)

## 2020-02-26 LAB — HEMOGLOBIN A1C: Hgb A1c MFr Bld: 5.8 % (ref 4.6–6.5)

## 2020-02-26 LAB — VITAMIN B12: Vitamin B-12: 980 pg/mL — ABNORMAL HIGH (ref 211–911)

## 2020-02-27 MED ORDER — CHOLESTYRAMINE 4 G PO PACK: 2.0000 g | PACK | Freq: Every day | ORAL | 2 refills | Status: DC

## 2020-03-05 LAB — INSULIN, FREE (BIOACTIVE): Insulin, Free: 25 u[IU]/mL — ABNORMAL HIGH (ref 1.5–14.9)

## 2020-06-13 ENCOUNTER — Other Ambulatory Visit: Payer: Self-pay | Admitting: Family Medicine

## 2020-06-13 DIAGNOSIS — E282 Polycystic ovarian syndrome: Secondary | ICD-10-CM

## 2020-07-20 IMAGING — MR MR HEAD WO/W CM
10 series · 48 of 48 positions shown · IV contrast (multihance)
Comparison: None.

CLINICAL DATA: Right temporal headache and right orbital pain over
the last 3-4 months. History of right optic neuritis.

EXAM:
MRI HEAD WITHOUT AND WITH CONTRAST
TECHNIQUE: Multiplanar, multiecho pulse sequences of the brain and surrounding
structures were obtained without and with intravenous contrast.
CONTRAST:  20mL MULTIHANCE GADOBENATE DIMEGLUMINE 529 MG/ML IV SOLN

[Series 2: t1_se_sag · sagittal · 5.0mm · 0.45mm/px · 1 of 23 slices shown]
[im 1/23]
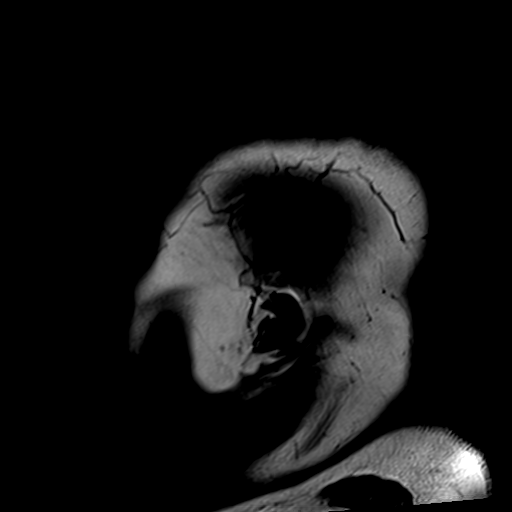

[Series 3: ep2d_diff_3 · axial · 3.0mm · 1.80mm/px · z∈[-66,+75]mm · 8 of 95 slices shown]
[im 1/95]
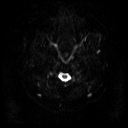
[im 14/95]
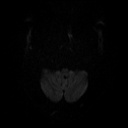
[im 27/95]
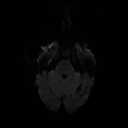
[im 41/95]
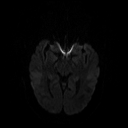
[im 54/95]
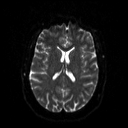
[im 68/95]
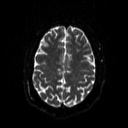
[im 81/95]
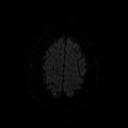
[im 95/95]
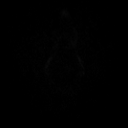

[Series 4: ep2d_diff_3_adc · axial · 3.0mm · 1.80mm/px · z∈[-66,+75]mm · 4 of 48 slices shown]
[im 1/48]
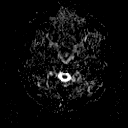
[im 16/48]
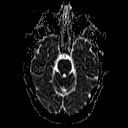
[im 32/48]
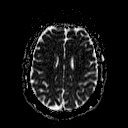
[im 48/48]
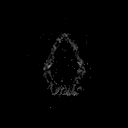

[Series 5: t2_tse_tra · axial · 5.0mm · 0.72mm/px · z∈[-61,+88]mm · 2 of 26 slices shown]
[im 1/26]
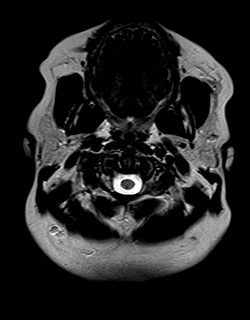
[im 26/26]
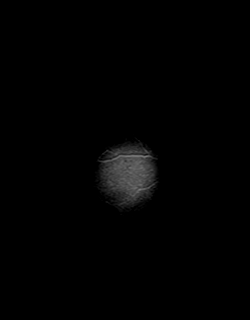

[Series 7: swi_images · axial · 4.0mm · 0.90mm/px · z∈[-56,+83]mm · 3 of 36 slices shown]
[im 1/36]
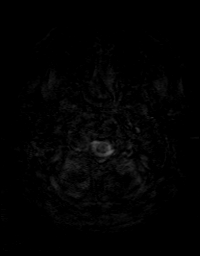
[im 18/36]
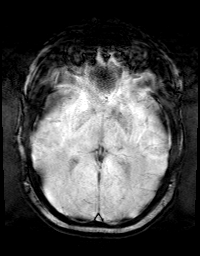
[im 36/36]
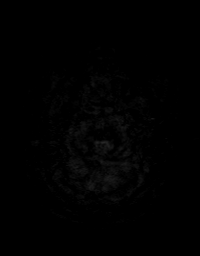

[Series 8: FLAIR · axial · 3.0mm · 0.43mm/px · z∈[-65,+90]mm · 2 of 27 slices shown]
[im 1/27]
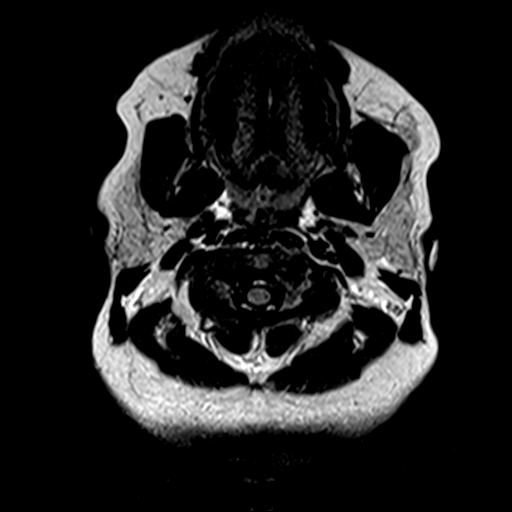
[im 27/27]
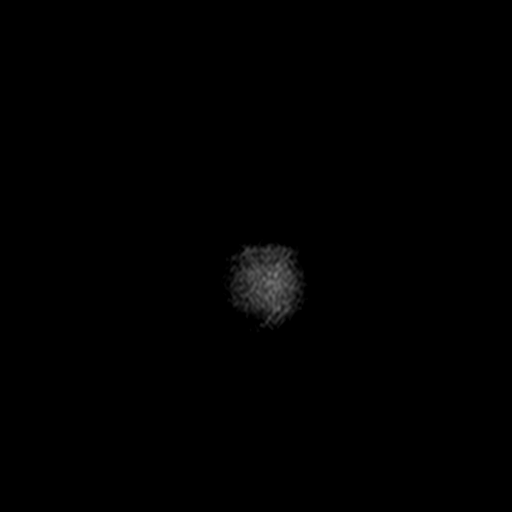

[Series 9: t1_mpr_tra · axial · 1.0mm · 0.72mm/px · z∈[-57,+85]mm · 12 of 144 slices shown]
[im 1/144]
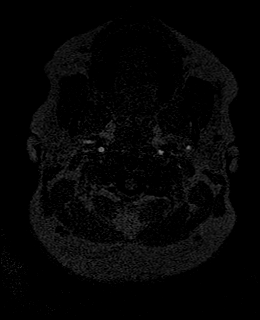
[im 14/144]
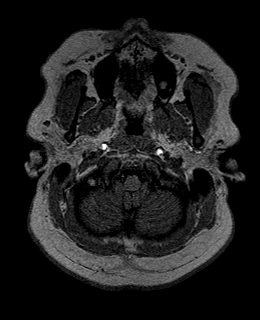
[im 27/144]
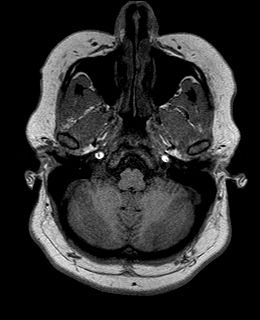
[im 40/144]
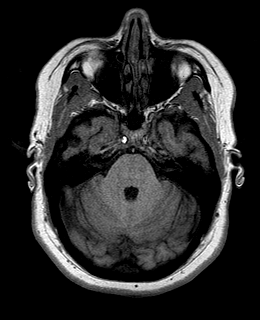
[im 53/144]
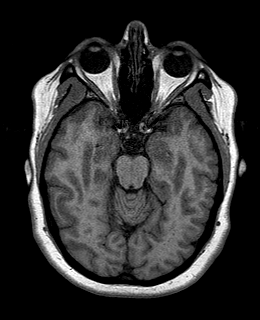
[im 66/144]
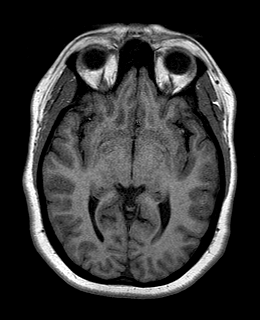
[im 79/144]
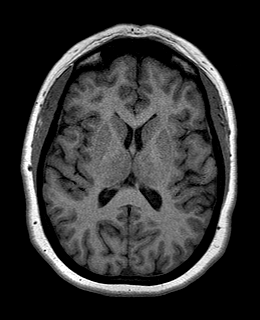
[im 92/144]
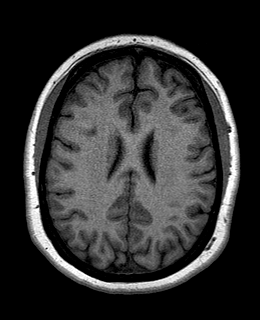
[im 105/144]
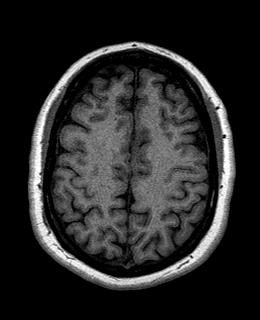
[im 118/144]
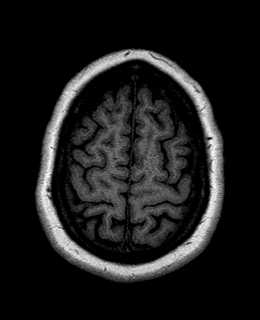
[im 131/144]
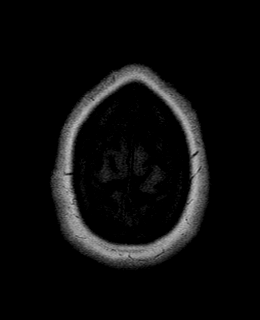
[im 144/144]
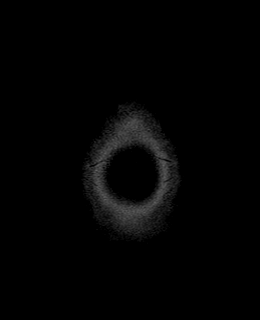

[Series 10: T2 · coronal · 5.0mm · 0.45mm/px · 2 of 26 slices shown]
[im 1/26]
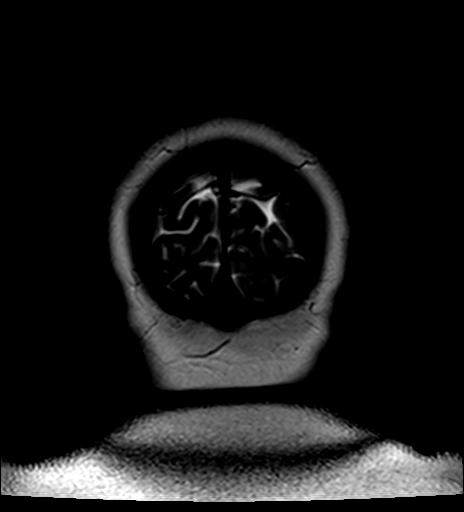
[im 26/26]
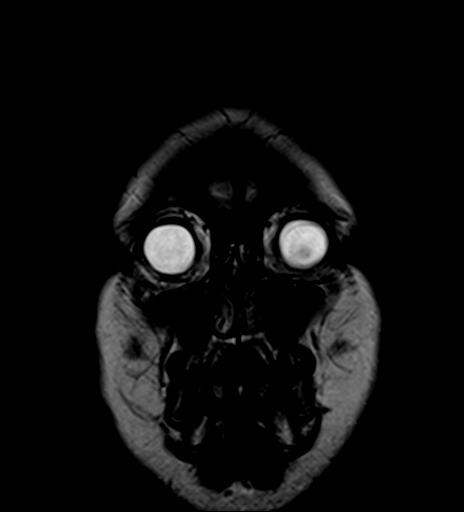

[Series 11: post t1_mpr_tra · axial · 1.0mm · 0.72mm/px · z∈[-57,+85]mm · 12 of 144 slices shown]
[im 1/144]
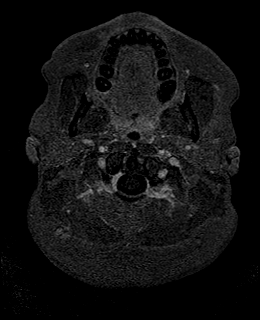
[im 14/144]
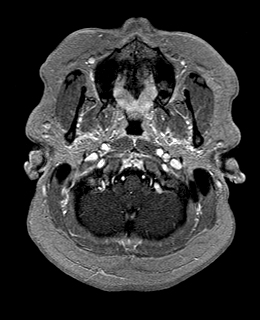
[im 27/144]
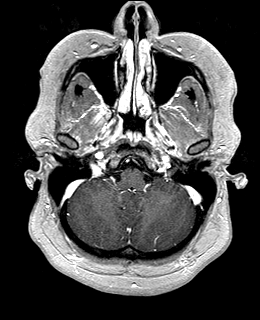
[im 40/144]
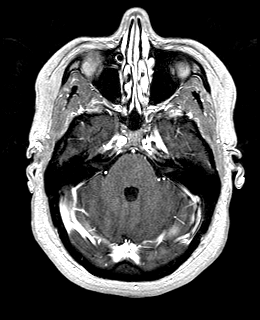
[im 53/144]
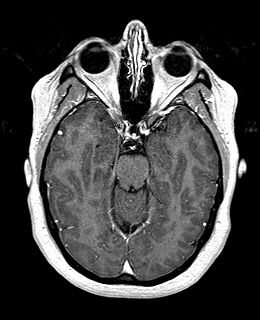
[im 66/144]
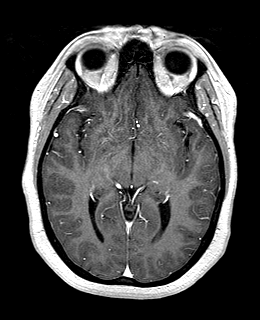
[im 79/144]
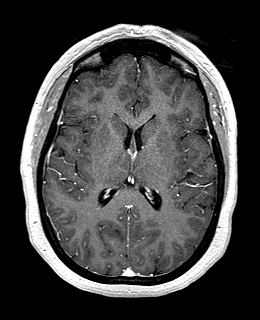
[im 92/144]
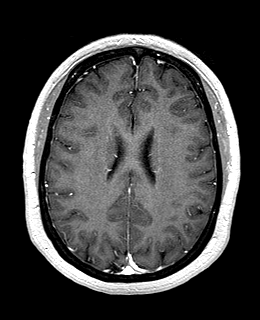
[im 105/144]
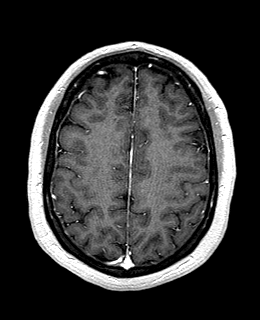
[im 118/144]
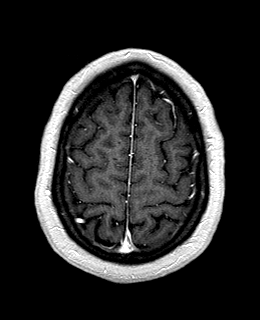
[im 131/144]
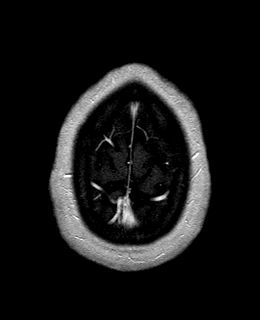
[im 144/144]
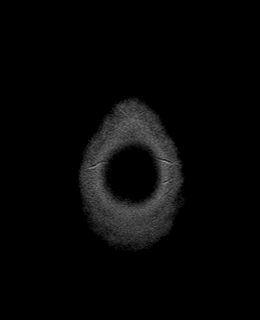

[Series 12: T1 post-contrast · coronal · 5.0mm · 0.72mm/px · 2 of 30 slices shown]
[im 1/30]
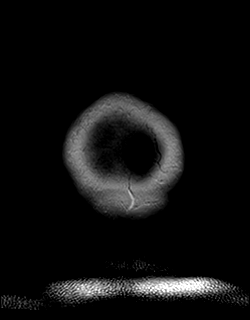
[im 30/30]
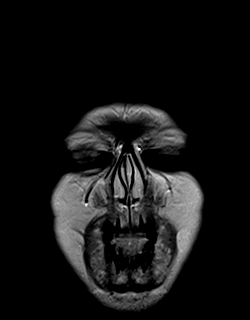

[48 of 48 positions shown; findings below may reference images not displayed]

FINDINGS: Brain: The brain has a normal appearance without evidence of
malformation, atrophy, old or acute small or large vessel
infarction, mass lesion, hemorrhage, hydrocephalus or extra-axial
collection. After contrast administration, no abnormal enhancement
occurs.

Vascular: Major vessels at the base of the brain show flow. Venous
sinuses appear patent.

Skull and upper cervical spine: Normal.

Sinuses/Orbits: Sinuses are clear. Orbits appear normal on this
standard brain study. No evidence optic neuritis, though a detailed
orbital examination was not ordered or performed.

Other: None significant.
IMPRESSION: Normal examination. No cause of headache or orbital pain identified.
No sign of demyelinating disease on this standard brain MR.

## 2020-08-02 ENCOUNTER — Other Ambulatory Visit: Payer: Self-pay | Admitting: Family Medicine

## 2020-08-02 DIAGNOSIS — K915 Postcholecystectomy syndrome: Secondary | ICD-10-CM

## 2020-08-11 ENCOUNTER — Telehealth: Payer: Self-pay

## 2020-08-11 ENCOUNTER — Other Ambulatory Visit: Payer: Self-pay

## 2020-08-11 DIAGNOSIS — K915 Postcholecystectomy syndrome: Secondary | ICD-10-CM

## 2020-08-11 NOTE — Telephone Encounter (Signed)
Pharmacy can't fill the Questran Rx with the directions of 1/2 of a packet. It has to be 1 full packet.

## 2020-08-12 MED ORDER — CHOLESTYRAMINE 4 G PO PACK: 4.0000 g | PACK | Freq: Every day | ORAL | 1 refills | Status: DC

## 2020-08-12 NOTE — Addendum Note (Signed)
Addended by: Kathreen Devoid on: 08/12/2020 08:27 AM   Modules accepted: Orders

## 2020-10-17 ENCOUNTER — Other Ambulatory Visit: Payer: Self-pay | Admitting: Family Medicine

## 2020-10-17 DIAGNOSIS — E282 Polycystic ovarian syndrome: Secondary | ICD-10-CM

## 2020-10-28 ENCOUNTER — Other Ambulatory Visit: Payer: Self-pay

## 2020-10-28 DIAGNOSIS — E282 Polycystic ovarian syndrome: Secondary | ICD-10-CM

## 2020-10-28 MED ORDER — METFORMIN HCL ER 500 MG PO TB24: ORAL_TABLET | ORAL | 1 refills | Status: DC

## 2021-01-22 ENCOUNTER — Other Ambulatory Visit: Payer: Self-pay | Admitting: Family Medicine

## 2021-01-22 DIAGNOSIS — K915 Postcholecystectomy syndrome: Secondary | ICD-10-CM

## 2021-04-22 ENCOUNTER — Other Ambulatory Visit: Payer: Self-pay | Admitting: Family Medicine

## 2021-04-22 DIAGNOSIS — E282 Polycystic ovarian syndrome: Secondary | ICD-10-CM

## 2021-04-25 NOTE — Progress Notes (Addendum)
? ?HPI: ?[name] is a 36 y.o. female, who is here today for her routine physical and follow up. ? ?Last CPE: 07/26/19 ? ?Regular exercise: Walking 30 min daily. ?Following a healthful diet: Cooking at home, plenty of vegetables, some meat, mainly chicken. ?She is participating in a wt loss program through [employer]. ? ?Chronic medical problems: PCOS,BMI 40,HLD,post cholecystectomy,and prediabetes among some. ? ?Immunization History  ?Administered Date(s) Administered  ? Moderna SARS-COV2 Booster Vaccination 12/17/2019  ? ?Health Maintenance  ?Topic Date Due  ? HIV Screening  Never done  ? COVID-19 Vaccine (1) 07/21/2021 (Originally 01/24/1986)  ? PAP SMEAR-Modifier  09/08/2021 (Originally 07/25/2007)  ? TETANUS/TDAP  06/04/2027  ? INFLUENZA VACCINE  Completed  ? Hepatitis C Screening  Completed  ? HPV VACCINES  Aged Out  ? ?Pap smear: 3-4 years ago. ?Hx of abnormal pap smears: Negative. ?Birth control: Husband has vasectomy. ?M:11 ?G:1 ?L:1 ?LMP: 04/21/21. ? ?Last follow up on 02/25/20. ?B12 deficiency:  ?Lab Results  ?Component Value Date  ? VITAMINB12 980 (H) 02/26/2020  ?Decreased B12 supplementation. ? ?Prediabetes: Negative for polydipsia,polyuria, or polyphagia. ?PCOS on Metformin XR 500 mg 2 tabs daily, which she has taken for 10+ years. ?Tolerating medication well. ?Having regular menses. ? ?Lab Results  ?Component Value Date  ? HGBA1C 5.8 02/26/2020  ? ?HLD on non pharmacologic treatment. ?Lab Results  ?Component Value Date  ? CHOL 184 02/26/2020  ? HDL 39.30 02/26/2020  ? LDLCALC 113 (H) 02/26/2020  ? LDLDIRECT 110.0 07/29/2019  ? TRIG 159.0 (H) 02/26/2020  ? CHOLHDL 5 02/26/2020  ? ?Chronic diarrhea, s/p cholecystectomy in 2018. She is on Questran 4 g 1/2 pkg daily. ?Diarrhea has improved after starting medication. ?No side effects. ? ?Occasional wheezing, last week. Hx of asthma. ?He is on Albuterol inh 2 puff qid prn, she needs a refill. ? ?Anxiety with flying, she had medication in the  past,2 tabs in 2016, does not recall name but helped her relax during the flight. She feels numbness of upper extremities and tense, no panic attacks.Marland Kitchen ?She will be flying with her husband and child. ? ?Review of Systems  ?Constitutional:  Negative for appetite change and fever.  ?HENT:  Negative for hearing loss, mouth sores, sore throat and voice change.   ?Eyes:  Negative for photophobia and visual disturbance.  ?Respiratory:  Positive for wheezing. Negative for cough and shortness of breath.   ?Cardiovascular:  Negative for chest pain and leg swelling.  ?Gastrointestinal:  Negative for abdominal pain, nausea and vomiting.  ?     No changes in bowel habits.  ?Endocrine: Negative for cold intolerance and heat intolerance.  ?Genitourinary:  Negative for decreased urine volume, dysuria, hematuria, vaginal bleeding and vaginal discharge.  ?Musculoskeletal:  Negative for gait problem and myalgias.  ?Skin:  Negative for color change and rash.  ?Allergic/Immunologic: Positive for environmental allergies.  ?Neurological:  Negative for syncope, weakness and headaches.  ?Hematological:  Negative for adenopathy. Does not bruise/bleed easily.  ?Psychiatric/Behavioral:  Negative for confusion and hallucinations.   ?All other systems reviewed and are negative. ? ?Current Outpatient Medications on File Prior to Visit  ?Medication Sig Dispense Refill  ? Cetirizine HCl (ZYRTEC ALLERGY PO) Take 1 tablet by mouth daily.    ? cholestyramine (QUESTRAN) 4 g packet MIX AND DRINK 1 PACKET (4 GRAMS TOTAL) DAILY 90 packet 1  ? ibuprofen (ADVIL,MOTRIN) 200 MG tablet Take 400 mg by mouth daily as needed for headache or moderate pain.    ? ?  No current facility-administered medications on file prior to visit.  ? ?Past Medical History:  ?Diagnosis Date  ? Asthma   ? Dysmenorrhea   ? GERD (gastroesophageal reflux disease)   ? Headache   ? migraines  ? History of chicken pox   ? PCOS (polycystic ovarian syndrome)   ? ? ?Past Surgical History:   ?Procedure Laterality Date  ? CHOLECYSTECTOMY N/A 10/05/2016  ? Procedure: LAPAROSCOPIC CHOLECYSTECTOMY;  Surgeon: Berna Bueonnor, Chelsea A, MD;  Location: [facility] OR;  Service: General;  Laterality: N/A;  ? WISDOM TOOTH EXTRACTION    ? ?No Known Allergies ? ?Family History  ?Problem Relation Age of Onset  ? Hypertension Mother   ? Diabetes Mother   ? Thyroid disease Mother   ? Emphysema Mother   ? Asthma Mother   ? COPD Mother   ? Hypertension Father   ? Stroke Father   ? Diabetes Maternal Grandfather   ? ?Social History  ? ?Socioeconomic History  ? Marital status: Married  ?  Spouse name: Not on file  ? Number of children: Not on file  ? Years of education: 1  ? Highest education level: Not on file  ?Occupational History  ? Not on file  ?Tobacco Use  ? Smoking status: Never  ? Smokeless tobacco: Never  ?Vaping Use  ? Vaping Use: Never used  ?Substance and Sexual Activity  ? Alcohol use: No  ? Drug use: No  ? Sexual activity: Yes  ?  Partners: Male  ?Other Topics Concern  ? Not on file  ?Social History Narrative  ? Not on file  ? ?Social Determinants of Health  ? ?Financial Resource Strain: Not on file  ?Food Insecurity: Not on file  ?Transportation Needs: Not on file  ?Physical Activity: Not on file  ?Stress: Not on file  ?Social Connections: Not on file  ? ?Vitals:  ? 04/26/21 0858  ?BP: 110/62  ?Pulse: 78  ?Resp: 12  ?Temp: 98.4 ?F (36.9 ?C)  ?SpO2: 99%  ? ?Body mass index is 37.08 kg/m?. ? ?Wt Readings from Last 3 Encounters:  ?04/26/21 216 lb (98 kg)  ?02/25/20 236 lb (107 kg)  ?07/26/19 238 lb 4 oz (108.1 kg)  ? ?Physical Exam ?Vitals and nursing note reviewed.  ?Constitutional:   ?   General: She is not in acute distress. ?   Appearance: She is well-developed.  ?HENT:  ?   Head: Normocephalic and atraumatic.  ?   Right Ear: Hearing, tympanic membrane, ear canal and external ear normal.  ?   Left Ear: Hearing, tympanic membrane, ear canal and external ear normal.  ?   Mouth/Throat:  ?   Mouth: Mucous membranes are moist.   ?   Pharynx: Oropharynx is clear. Uvula midline.  ?Eyes:  ?   Extraocular Movements: Extraocular movements intact.  ?   Conjunctiva/sclera: Conjunctivae normal.  ?   Pupils: Pupils are equal, round, and reactive to light.  ?Neck:  ?   Thyroid: No thyromegaly.  ?   Trachea: No tracheal deviation.  ?Cardiovascular:  ?   Rate and Rhythm: Normal rate and regular rhythm.  ?   Pulses:     ?     Dorsalis pedis pulses are 2+ on the right side and 2+ on the left side.  ?   Heart sounds: No murmur heard. ?Pulmonary:  ?   Effort: Pulmonary effort is normal. No respiratory distress.  ?   Breath sounds: Normal breath sounds.  ?Abdominal:  ?  Palpations: Abdomen is soft. There is no hepatomegaly or mass.  ?   Tenderness: There is no abdominal tenderness.  ?Genitourinary: ?   Comments: Does not want gyn exam today. ?Musculoskeletal:  ?   Comments: No major deformity or signs of synovitis appreciated.  ?Lymphadenopathy:  ?   Cervical: No cervical adenopathy.  ?   Upper Body:  ?   Right upper body: No supraclavicular adenopathy.  ?   Left upper body: No supraclavicular adenopathy.  ?Skin: ?   General: Skin is warm.  ?   Findings: No erythema or rash.  ?Neurological:  ?   General: No focal deficit present.  ?   Mental Status: She is alert and oriented to person, place, and time.  ?   Cranial Nerves: No cranial nerve deficit.  ?   Coordination: Coordination normal.  ?   Gait: Gait normal.  ?   Deep Tendon Reflexes:  ?   Reflex Scores: ?     Bicep reflexes are 2+ on the right side and 2+ on the left side. ?     Patellar reflexes are 2+ on the right side and 2+ on the left side. ?Psychiatric:     ?   Mood and Affect: Mood and affect normal.  ?   Comments: Well groomed, good eye contact.  ? ?ASSESSMENT AND PLAN: ? ?Ms. Patricia Weaver was here today annual physical examination and follow up. ? ?Orders Placed This Encounter  ?Procedures  ? Comprehensive metabolic panel  ? Lipid panel  ? Hemoglobin A1c  ? Vitamin B12  ? LDL cholesterol,  direct  ? ?Lab Results  ?Component Value Date  ? [id number] 04/26/2021  ? ?Lab Results  ?Component Value Date  ? HGBA1C 5.9 04/26/2021  ? ?Lab Results  ?Component Value Date  ? CREATININE 0.83 04/26/2021

## 2021-04-26 ENCOUNTER — Ambulatory Visit (INDEPENDENT_AMBULATORY_CARE_PROVIDER_SITE_OTHER): Payer: BC Managed Care – PPO | Admitting: Family Medicine

## 2021-04-26 VITALS — BP 110/62 | HR 78 | Temp 98.4°F | Resp 12 | Ht 64.0 in | Wt 216.0 lb

## 2021-04-26 DIAGNOSIS — Z1329 Encounter for screening for other suspected endocrine disorder: Secondary | ICD-10-CM

## 2021-04-26 DIAGNOSIS — E538 Deficiency of other specified B group vitamins: Secondary | ICD-10-CM

## 2021-04-26 DIAGNOSIS — J4520 Mild intermittent asthma, uncomplicated: Secondary | ICD-10-CM | POA: Diagnosis not present

## 2021-04-26 DIAGNOSIS — Z13228 Encounter for screening for other metabolic disorders: Secondary | ICD-10-CM | POA: Diagnosis not present

## 2021-04-26 DIAGNOSIS — E282 Polycystic ovarian syndrome: Secondary | ICD-10-CM | POA: Diagnosis not present

## 2021-04-26 DIAGNOSIS — E785 Hyperlipidemia, unspecified: Secondary | ICD-10-CM | POA: Diagnosis not present

## 2021-04-26 DIAGNOSIS — Z0000 Encounter for general adult medical examination without abnormal findings: Secondary | ICD-10-CM | POA: Diagnosis not present

## 2021-04-26 DIAGNOSIS — Z130 Encounter for screening for diseases of the blood and blood-forming organs and certain disorders involving the immune mechanism: Secondary | ICD-10-CM

## 2021-04-26 DIAGNOSIS — K915 Postcholecystectomy syndrome: Secondary | ICD-10-CM

## 2021-04-26 DIAGNOSIS — Z6837 Body mass index (BMI) 37.0-37.9, adult: Secondary | ICD-10-CM

## 2021-04-26 DIAGNOSIS — Z114 Encounter for screening for human immunodeficiency virus [HIV]: Secondary | ICD-10-CM

## 2021-04-26 DIAGNOSIS — F419 Anxiety disorder, unspecified: Secondary | ICD-10-CM | POA: Insufficient documentation

## 2021-04-26 DIAGNOSIS — F40243 Fear of flying: Secondary | ICD-10-CM

## 2021-04-26 LAB — COMPREHENSIVE METABOLIC PANEL
ALT: 44 U/L — ABNORMAL HIGH (ref 0–35)
AST: 31 U/L (ref 0–37)
Albumin: 4.4 g/dL (ref 3.5–5.2)
Alkaline Phosphatase: 47 U/L (ref 39–117)
BUN: 12 mg/dL (ref 6–23)
CO2: 28 mEq/L (ref 19–32)
Calcium: 9.6 mg/dL (ref 8.4–10.5)
Chloride: 103 mEq/L (ref 96–112)
Creatinine, Ser: 0.83 mg/dL (ref 0.40–1.20)
GFR: 91.76 mL/min (ref >60.00)
Glucose, Bld: 92 mg/dL (ref 70–99)
Potassium: 4 mEq/L (ref 3.5–5.1)
Sodium: 139 mEq/L (ref 135–145)
Total Bilirubin: 0.4 mg/dL (ref 0.2–1.2)
Total Protein: 7.4 g/dL (ref 6.0–8.3)

## 2021-04-26 LAB — LIPID PANEL
Cholesterol: 179 mg/dL (ref 0–200)
HDL: 37.2 mg/dL — ABNORMAL LOW (ref >39.00)
NonHDL: 142.04
Total CHOL/HDL Ratio: 5
Triglycerides: 213 mg/dL — ABNORMAL HIGH (ref 0.0–149.0)
VLDL: 42.6 mg/dL — ABNORMAL HIGH (ref 0.0–40.0)

## 2021-04-26 LAB — HEMOGLOBIN A1C: Hgb A1c MFr Bld: 5.9 % (ref 4.6–6.5)

## 2021-04-26 LAB — LDL CHOLESTEROL, DIRECT: Direct LDL: 123 mg/dL

## 2021-04-26 MED ORDER — ALPRAZOLAM 0.25 MG PO TABS: ORAL_TABLET | ORAL | 0 refills | Status: DC

## 2021-04-26 MED ORDER — METFORMIN HCL ER 500 MG PO TB24: ORAL_TABLET | ORAL | 3 refills | Status: DC

## 2021-04-26 MED ORDER — ALBUTEROL SULFATE HFA 108 (90 BASE) MCG/ACT IN AERS: 2.0000 | INHALATION_SPRAY | Freq: Four times a day (QID) | RESPIRATORY_TRACT | 2 refills | Status: DC | PRN

## 2021-04-26 NOTE — Assessment & Plan Note (Addendum)
She does not recall medication she took in 2016, we discussed options: Hydroxyzine vs Xanax, she prefers the latter one. ?Xanax 0.25 mg when ready to fly, some side effects discussed. ?

## 2021-04-26 NOTE — Assessment & Plan Note (Signed)
Continue same dose of B12. ?Further recommendations according to B12 results.  ?

## 2021-04-26 NOTE — Patient Instructions (Addendum)
A few things to remember from today's visit: ? ? ?Routine general medical examination at a health care facility - Plan: Comprehensive metabolic panel, Lipid panel ? ?Hyperlipidemia, unspecified hyperlipidemia type - Plan: Lipid panel ? ?Screening for endocrine, metabolic and immunity disorder - Plan: Comprehensive metabolic panel, Hemoglobin A1c ? ?Screening for HIV (human immunodeficiency virus) ? ?Asthma, intermittent, uncomplicated - Plan: albuterol (VENTOLIN HFA) 108 (90 Base) MCG/ACT inhaler ? ?PCOS (polycystic ovarian syndrome) - Plan: metFORMIN (GLUCOPHAGE-XR) 500 MG 24 hr tablet ? ?Anxiety with flying - Plan: ALPRAZolam (XANAX) 0.25 MG tablet ? ?If you need refills please call your pharmacy. ?Do not use My Chart to request refills or for acute issues that need immediate attention. ?  ?Please be sure medication list is accurate. ?If a new problem present, please set up appointment sooner than planned today. ? ?Health Maintenance, Female ?Adopting a healthy lifestyle and getting preventive care are important in promoting health and wellness. Ask your health care provider about: ?The right schedule for you to have regular tests and exams. ?Things you can do on your own to prevent diseases and keep yourself healthy. ?What should I know about diet, weight, and exercise? ?Eat a healthy diet ? ?Eat a diet that includes plenty of vegetables, fruits, low-fat dairy products, and lean protein. ?Do not eat a lot of foods that are high in solid fats, added sugars, or sodium. ?Maintain a healthy weight ?Body mass index (BMI) is used to identify weight problems. It estimates body fat based on height and weight. Your health care provider can help determine your BMI and help you achieve or maintain a healthy weight. ?Get regular exercise ?Get regular exercise. This is one of the most important things you can do for your health. Most adults should: ?Exercise for at least 150 minutes each week. The exercise should increase  your heart rate and make you sweat (moderate-intensity exercise). ?Do strengthening exercises at least twice a week. This is in addition to the moderate-intensity exercise. ?Spend less time sitting. Even light physical activity can be beneficial. ?Watch cholesterol and blood lipids ?Have your blood tested for lipids and cholesterol at 35 years of age, then have this test every 5 years. ?Have your cholesterol levels checked more often if: ?Your lipid or cholesterol levels are high. ?You are older than 35 years of age. ?You are at high risk for heart disease. ?What should I know about cancer screening? ?Depending on your health history and family history, you may need to have cancer screening at various ages. This may include screening for: ?Breast cancer. ?Cervical cancer. ?Colorectal cancer. ?Skin cancer. ?Lung cancer. ?What should I know about heart disease, diabetes, and high blood pressure? ?Blood pressure and heart disease ?High blood pressure causes heart disease and increases the risk of stroke. This is more likely to develop in people who have high blood pressure readings or are overweight. ?Have your blood pressure checked: ?Every 3-5 years if you are 6-25 years of age. ?Every year if you are 60 years old or older. ?Diabetes ?Have regular diabetes screenings. This checks your fasting blood sugar level. Have the screening done: ?Once every three years after age 34 if you are at a normal weight and have a low risk for diabetes. ?More often and at a younger age if you are overweight or have a high risk for diabetes. ?What should I know about preventing infection? ?Hepatitis B ?If you have a higher risk for hepatitis B, you should be screened for this virus. Talk  with your health care provider to find out if you are at risk for hepatitis B infection. ?Hepatitis C ?Testing is recommended for: ?Everyone born from [birth year] through 1964. ?Anyone with known risk factors for hepatitis C. ?Sexually transmitted infections  (STIs) ?Get screened for STIs, including gonorrhea and chlamydia, if: ?You are sexually active and are younger than 35 years of age. ?You are older than 35 years of age and your health care provider tells you that you are at risk for this type of infection. ?Your sexual activity has changed since you were last screened, and you are at increased risk for chlamydia or gonorrhea. Ask your health care provider if you are at risk. ?Ask your health care provider about whether you are at high risk for HIV. Your health care provider may recommend a prescription medicine to help prevent HIV infection. If you choose to take medicine to prevent HIV, you should first get tested for HIV. You should then be tested every 3 months for as long as you are taking the medicine. ?Pregnancy ?If you are about to stop having your period (premenopausal) and you may become pregnant, seek counseling before you get pregnant. ?Take 400 to 800 micrograms (mcg) of folic acid every day if you become pregnant. ?Ask for birth control (contraception) if you want to prevent pregnancy. ?Osteoporosis and menopause ?Osteoporosis is a disease in which the bones lose minerals and strength with aging. This can result in bone fractures. If you are 7 years old or older, or if you are at risk for osteoporosis and fractures, ask your health care provider if you should: ?Be screened for bone loss. ?Take a calcium or vitamin D supplement to lower your risk of fractures. ?Be given hormone replacement therapy (HRT) to treat symptoms of menopause. ?Follow these instructions at home: ?Alcohol use ?Do not drink alcohol if: ?Your health care provider tells you not to drink. ?You are pregnant, may be pregnant, or are planning to become pregnant. ?If you drink alcohol: ?Limit how much you have to: ?0-1 drink a day. ?Know how much alcohol is in your drink. In the [place] one drink equals one 12 oz bottle of beer (355 mL), one 5 oz glass of wine (148 mL), or one 1? oz glass  of hard liquor (44 mL). ?Lifestyle ?Do not use any products that contain nicotine or tobacco. These products include cigarettes, chewing tobacco, and vaping devices, such as e-cigarettes. If you need help quitting, ask your health care provider. ?Do not use street drugs. ?Do not share needles. ?Ask your health care provider for help if you need support or information about quitting drugs. ?General instructions ?Schedule regular health, dental, and eye exams. ?Stay current with your vaccines. ?Tell your health care provider if: ?You often feel depressed. ?You have ever been abused or do not feel safe at home. ?Summary ?Adopting a healthy lifestyle and getting preventive care are important in promoting health and wellness. ?Follow your health care provider's instructions about healthy diet, exercising, and getting tested or screened for diseases. ?Follow your health care provider's instructions on monitoring your cholesterol and blood pressure. ?This information is not intended to replace advice given to you by your health care provider. Make sure you discuss any questions you have with your health care provider. ?Document Revised: 06/08/2020 Document Reviewed: 06/08/2020 ?Elsevier Patient Education ? 2022 [employer] ? ?

## 2021-04-26 NOTE — Assessment & Plan Note (Addendum)
In general problem is well controlled, has some wheezing last week. ?Albuterol inh 2 puff qid prn to continue. ?If symptoms become more frequent, we may need to add ICS. ?As far as problem is stable annual follow up can continue. ?

## 2021-04-26 NOTE — Assessment & Plan Note (Addendum)
Problem is well controlled. ?Continue Questran 4 g 1/2 pk daily. ?Continue following annually, before if needed. ?

## 2021-04-26 NOTE — Assessment & Plan Note (Addendum)
She has lost about 20 Lb since last visit, congratulated. ?She understands the benefits of wt loss as well as adverse effects of obesity. ?Consistency with healthy diet and physical activity encouraged. ? ?

## 2021-04-26 NOTE — Assessment & Plan Note (Addendum)
Having regular menses. ?Continue Metformin XR 500 mg bid. ?

## 2021-04-27 LAB — VITAMIN B12: Vitamin B-12: 483 pg/mL (ref 211–911)

## 2021-04-29 ENCOUNTER — Encounter: Payer: Self-pay | Admitting: Family Medicine

## 2021-05-19 NOTE — Progress Notes (Signed)
? ?HPI: ?Patricia Weaver is a 36 y.o. female, who is here today for her pap smear. ?She was seen for her CPE on 04/26/21, did not want pap smear done at that time. We do not have a copy of her last pap smear. ? ?Pap smear: 3-4 years ago. ?Hx of abnormal pap smears: Negative. ?Birth control: Husband has vasectomy. ?M:11 ?G:1 ?L:1 ?LMP: started today. ? ?Review of Systems  ?Constitutional:  Negative for chills and fever.  ?Genitourinary:  Negative for menstrual problem, pelvic pain and vaginal discharge.  ?Rest see pertinent positives and negatives per HPI. ? ?Current Outpatient Medications on File Prior to Visit  ?Medication Sig Dispense Refill  ? albuterol (VENTOLIN HFA) 108 (90 Base) MCG/ACT inhaler Inhale 2 puffs into the lungs every 6 (six) hours as needed for wheezing or shortness of breath. 8 g 2  ? ALPRAZolam (XANAX) 0.25 MG tablet 1 tab before flying. 2 tablet 0  ? Cetirizine HCl (ZYRTEC ALLERGY PO) Take 1 tablet by mouth daily.    ? cholestyramine (QUESTRAN) 4 g packet MIX AND DRINK 1 PACKET (4 GRAMS TOTAL) DAILY 90 packet 1  ? ibuprofen (ADVIL,MOTRIN) 200 MG tablet Take 400 mg by mouth daily as needed for headache or moderate pain.    ? metFORMIN (GLUCOPHAGE-XR) 500 MG 24 hr tablet TAKE 1 TABLET TWICE A DAY WITH MEALS 180 tablet 3  ? ?No current facility-administered medications on file prior to visit.  ? ? ?Past Medical History:  ?Diagnosis Date  ? Asthma   ? Dysmenorrhea   ? GERD (gastroesophageal reflux disease)   ? Headache   ? migraines  ? History of chicken pox   ? PCOS (polycystic ovarian syndrome)   ? ?No Known Allergies ? ?Social History  ? ?Socioeconomic History  ? Marital status: Married  ?  Spouse name: Not on file  ? Number of children: Not on file  ? Years of education: 1  ? Highest education level: Not on file  ?Occupational History  ? Not on file  ?Tobacco Use  ? Smoking status: Never  ? Smokeless tobacco: Never  ?Vaping Use  ? Vaping Use: Never used  ?Substance and Sexual Activity  ?  Alcohol use: No  ? Drug use: No  ? Sexual activity: Yes  ?  Partners: Male  ?Other Topics Concern  ? Not on file  ?Social History Narrative  ? Not on file  ? ?Social Determinants of Health  ? ?Financial Resource Strain: Not on file  ?Food Insecurity: Not on file  ?Transportation Needs: Not on file  ?Physical Activity: Not on file  ?Stress: Not on file  ?Social Connections: Not on file  ? ? ?Vitals:  ? 05/21/21 1422  ?BP: 136/80  ?Pulse: 60  ?Resp: 16  ?Temp: 98.7 ?F (37.1 ?C)  ?SpO2: 99%  ? ?Wt Readings from Last 3 Encounters:  ?05/21/21 217 lb 6 oz (98.6 kg)  ?04/26/21 216 lb (98 kg)  ?02/25/20 236 lb (107 kg)  ? ?Body mass index is 37.31 kg/m?. ? ?Physical Exam ?Vitals and nursing note reviewed. Exam conducted with a chaperone present.  ?Constitutional:   ?   General: She is not in acute distress. ?   Appearance: She is well-developed.  ?HENT:  ?   Head: Normocephalic and atraumatic.  ?   Mouth/Throat:  ?   Pharynx: Uvula midline.  ?Eyes:  ?   Conjunctiva/sclera: Conjunctivae normal.  ?Cardiovascular:  ?   Rate and Rhythm: Normal rate and regular rhythm.  ?  Pulmonary:  ?   Effort: Pulmonary effort is normal. No respiratory distress.  ?   Breath sounds: Normal breath sounds.  ?Abdominal:  ?   Palpations: Abdomen is soft. There is no hepatomegaly or mass.  ?   Tenderness: There is no abdominal tenderness.  ?Genitourinary: ?   Exam position: Lithotomy position.  ?   Labia:     ?   Right: No rash, tenderness or lesion.     ?   Left: No rash, tenderness or lesion.   ?   Vagina: No signs of injury and foreign body. Bleeding (menstrual) present. No vaginal discharge, erythema, tenderness or lesions.  ?   Cervix: Cervical bleeding present. No cervical motion tenderness, discharge, friability, lesion or erythema.  ?   Uterus: Not enlarged and not tender.   ?   Adnexa:     ?   Right: No mass, tenderness or fullness.      ?   Left: No mass, tenderness or fullness.    ?   Comments: Pap smear collected. ?Lymphadenopathy:  ?    Lower Body: No right inguinal adenopathy.  ?Skin: ?   General: Skin is warm.  ?   Findings: No erythema or rash.  ?Neurological:  ?   Mental Status: She is alert and oriented to person, place, and time.  ?Psychiatric:  ?   Comments: Well groomed, good eye contact.  ? ?ASSESSMENT AND PLAN: ? ?Ms.Keirah was seen today for gynecologic exam. ? ?Diagnoses and all orders for this visit: ? ?Cervical cancer screening ?-     PAP [] ? ?We discussed current recommendations for cervical cancer screening. ?Will repeat in 5 years if negative for malignancy and negative HPV. ? ?Return in about 1 year (around 05/22/2022). ? ?Hermenegildo Clausen G. [place], MD ? ?[facility]. ?[place] office. ? ?

## 2021-05-21 ENCOUNTER — Other Ambulatory Visit (HOSPITAL_COMMUNITY)
Admission: RE | Admit: 2021-05-21 | Discharge: 2021-05-21 | Disposition: A | Discharge status: Home / Self Care | Payer: BC Managed Care – PPO | Source: Ambulatory Visit | Attending: Family Medicine | Admitting: Family Medicine

## 2021-05-21 ENCOUNTER — Ambulatory Visit (INDEPENDENT_AMBULATORY_CARE_PROVIDER_SITE_OTHER): Payer: BC Managed Care – PPO | Admitting: Family Medicine

## 2021-05-21 ENCOUNTER — Encounter: Payer: Self-pay | Admitting: Family Medicine

## 2021-05-21 VITALS — BP 136/80 | HR 60 | Temp 98.7°F | Resp 16 | Ht 64.0 in | Wt 217.4 lb

## 2021-05-21 DIAGNOSIS — Z124 Encounter for screening for malignant neoplasm of cervix: Secondary | ICD-10-CM | POA: Diagnosis not present

## 2021-05-21 NOTE — Patient Instructions (Signed)
A few things to remember from today's visit: ? ?Cervical cancer screening - Plan: PAP [[place]] ? ?If you need refills please call your pharmacy. ?Do not use My Chart to request refills or for acute issues that need immediate attention. ?  ?Preventing Cervical Cancer ?Cervical cancer is cancer that grows on the cervix. The cervix is at the bottom of the uterus. It connects the uterus to the vagina. The uterus is where a baby develops during pregnancy. ?Cancer occurs when cells become abnormal and start to grow out of control. If cervical cancer is not found early, it can spread and become dangerous. Cervical cancer cannot always be prevented, but you can take steps to lower your risk of developing this condition. ?How can this condition affect me? ?Cervical cancer grows slowly and may not cause any symptoms at first. Over time, the cancer can grow deep into the cervix tissue and spread to other areas. This may take years, and it may happen without you knowing about it. ?If it is found early, cervical cancer can be treated effectively. If the cancer has grown deep into your cervix or has spread, it will be more difficult to treat. ?Most cases of cervical cancer are caused by an STI (sexually transmitted infection) called human papillomavirus (HPV). One way to reduce your risk of cervical cancer is to take steps to avoid infection with the HPV virus. Getting regular Pap tests is also important because this can help identify changes in cells that could lead to cancer. Your chances of getting this disease can also be reduced by making certain lifestyle changes. ?What can increase my risk? ?You are more likely to develop this condition if: ?You have certain things in your sexual history, such as: ?Having a sexually transmitted viral infection. These include chlamydia and herpes. ?Having more than one sexual partner, or having sex with someone who has more than one sexual partner. ?Not using condoms during  sex. ?Having been sexually active before the age of 48. ?Your mother took a medicine called diethylstilbestrol (DES) while pregnant with you, causing you to be exposed to this medicine before birth. ?Your mother or sister has had cervical cancer. ?You are between the ages of 73-50. ?You have or have had certain other medical conditions, such as: ?Previous cancer of the vagina or vulva. ?A weakened body defense system (immune system). ?A history of dysplasia of the cervix. ?You use oral contraceptives, also called birth control pills. ?You smoke or breathe in secondhand smoke. ?What actions can I take to prevent cervical cancer? ?Preventing HPV infection ? ?Ask your health care provider about getting the HPV vaccine. If you are 80 years old or younger, you may need to get this vaccine, which is given in three doses over 6 months. This vaccine protects against the types of HPV that could cause cancer. ?Limit the number of people you have sex with. Also avoid having sex with people who have had many sex partners. ?Use a latex condom every time you have sex. ?Getting Pap tests ?Get Pap tests regularly, starting at age 2. Talk with your health care provider about how often you need these tests. Having regular Pap tests will help identify changes in cells that could lead to cancer. Steps can then be taken to prevent cancer from developing. ?Most women who are 75?35 years of age should have a Pap test every 3 years. ?Most women who are 68?35 years of age should have a Pap test in combination with an HPV  test every 5 years. ?Women with a higher risk of cervical cancer, such as those with a weakened immune system or those who were exposed to DES medicine before birth, may need more frequent testing. ?Making other lifestyle changes ? ?Do not use any products that contain nicotine or tobacco, such as cigarettes, e-cigarettes, and chewing tobacco. If you need help quitting, ask your health care provider. ?Eat a healthy diet  that includes at least 5 servings of fruits and vegetables every day. ?Lose weight if you are overweight. ?Where to find support ?Talk with your health care provider, school nurse, or local health department for guidance about screening and vaccination. ?Some children and teens may be able to get the HPV vaccine free of charge through the U.S. government's Vaccines for Children [facility]) program. Other places that provide vaccinations include: ?Public health clinics. Check with your local health department. ?[place], where you would pay only what you can afford. To find one near you, check this website: http://lyons.com/ ?[place]. These are part of a program for Medicare and Medicaid patients who live in rural areas. ?The National Breast and Cervical Cancer Early Detection Program also provides breast and cervical cancer screenings and diagnostic services to low-income, uninsured, and underinsured women. ?Cervical cancer can be passed down through families. Talk with your health care provider or a genetic counselor to learn more about genetic testing for cancer. ?Where to find more information ?Learn more about cervical cancer from: ?[employer]: www.acog.org ?American Cancer Society: www.cancer.org ?Centers for Disease Control and Prevention: http://www.wolf.info/ ?Contact a health care provider if you have: ?Pelvic pain. ?Unusual discharge or bleeding from your vagina. ?Summary ?Cervical cancer is cancer that grows on the cervix. The cervix is at the bottom of the uterus. ?Ask your health care provider about getting the HPV vaccine. ?Be sure to get regular Pap tests as recommended by your health care provider. ?See your health care provider right away if you have any pelvic pain or unusual discharge or bleeding from your vagina. ?This information is not intended to replace advice given to you by your health care provider. Make sure you discuss any questions you  have with your health care provider. ?Document Revised: 08/20/2018 Document Reviewed: 08/20/2018 ?Elsevier Patient Education ? [employer]. ? ?Please be sure medication list is accurate. ?If a new problem present, please set up appointment sooner than planned today. ? ? ? ? ? ? ? ?

## 2021-05-25 LAB — CYTOLOGY - PAP
Comment: NEGATIVE
Diagnosis: NEGATIVE
High risk HPV: NEGATIVE

## 2021-07-15 ENCOUNTER — Other Ambulatory Visit: Payer: Self-pay | Admitting: Family Medicine

## 2021-07-15 DIAGNOSIS — K915 Postcholecystectomy syndrome: Secondary | ICD-10-CM

## 2022-02-11 NOTE — Progress Notes (Signed)
ACUTE VISIT Chief Complaint  Patient presents with   Weight Gain   HPI: Patricia Weaver is a 37 y.o. female with PMHx significant for prediabetes,PCOS,HLD,chronic fatigue,and post cholecystectomy synd here today with concerns about her HgA1C levels, as her A1c in March/2023 was 5.9. She would like to discuss diabetes prevention. Negative for abdominal pain, nausea,vomiting, polydipsia,polyuria, or polyphagia.  She is currently taking metformin XR 500 mg bid for PCOS. Her last menstrual period was two weeks ago.   She reports a decrease in exercise routine. She likes to exercise outdoors but states that cold weather exacerbates her asthma. She is on Albuterol inh, which she does not need frequently.  Regular exercise also limited by months of foot/heel pain, which she believes may be plantar fasciitis. No edema or erythema. Exacerbated by walking barefoot, pain has improved since she stopped doing so.  She is also concerned about wt gain. She has lost wt when she has been consistent with a healthier life style. She lost 20 pounds last year but has since gained weight. She is currently engaging in indoor dancing for 20 minutes, two or three times per week as a form of exercise.  She describes her diet as improved compared to a year ago, with increased vegetable consumption, but still indulges in cheese and cookies. She expresses interest in potentially attending a weight loss clinic and discussing medication options for weight loss, as her mother has had success with Ozempic.  Review of Systems  Constitutional:  Negative for chills and fever.  Respiratory:  Negative for cough, shortness of breath and wheezing.   Cardiovascular:  Negative for chest pain, palpitations and leg swelling.  Endocrine: Negative for cold intolerance and heat intolerance.  Skin:  Negative for rash.  Neurological:  Negative for syncope and weakness.  See other pertinent positives and negatives in  HPI.  Current Outpatient Medications on File Prior to Visit  Medication Sig Dispense Refill   albuterol (VENTOLIN HFA) 108 (90 Base) MCG/ACT inhaler Inhale 2 puffs into the lungs every 6 (six) hours as needed for wheezing or shortness of breath. 8 g 2   ALPRAZolam (XANAX) 0.25 MG tablet 1 tab before flying. 2 tablet 0   Cetirizine HCl (ZYRTEC ALLERGY PO) Take 1 tablet by mouth daily.     cholestyramine (QUESTRAN) 4 g packet MIX AND DRINK 1 PACKET (4 GRAMS TOTAL) DAILY 90 packet 3   ibuprofen (ADVIL,MOTRIN) 200 MG tablet Take 400 mg by mouth daily as needed for headache or moderate pain.     metFORMIN (GLUCOPHAGE-XR) 500 MG 24 hr tablet TAKE 1 TABLET TWICE A DAY WITH MEALS 180 tablet 3   No current facility-administered medications on file prior to visit.   No Known Allergies  Social History   Socioeconomic History   Marital status: Married    Spouse name: Not on file   Number of children: Not on file   Years of education: 1   Highest education level: 12th grade  Occupational History   Not on file  Tobacco Use   Smoking status: Never   Smokeless tobacco: Never  Vaping Use   Vaping Use: Never used  Substance and Sexual Activity   Alcohol use: No   Drug use: No   Sexual activity: Yes    Partners: Male  Other Topics Concern   Not on file  Social History Narrative   Not on file   Social Determinants of Health   Financial Resource Strain: Low Risk  (02/14/2022)  Overall Financial Resource Strain (CARDIA)    Difficulty of Paying Living Expenses: Not hard at all  Food Insecurity: No Food Insecurity (02/14/2022)   Hunger Vital Sign    Worried About Running Out of Food in the Last Year: Never true    Ran Out of Food in the Last Year: Never true  Transportation Needs: No Transportation Needs (02/14/2022)   PRAPARE - Hydrologist (Medical): No    Lack of Transportation (Non-Medical): No  Physical Activity: Insufficiently Active (02/14/2022)    Exercise Vital Sign    Days of Exercise per Week: 2 days    Minutes of Exercise per Session: 20 min  Stress: No Stress Concern Present (02/14/2022)   [place]    Feeling of Stress : Not at all  Social Connections: Moderately Integrated (02/14/2022)   Social Connection and Isolation Panel [NHANES]    Frequency of Communication with Friends and Family: More than three times a week    Frequency of Social Gatherings with Friends and Family: Three times a week    Attends Religious Services: Never    Active Member of Clubs or Organizations: Yes    Attends Archivist Meetings: More than 4 times per year    Marital Status: Married   Today's Vitals   02/14/22 1418  BP: 128/80  Pulse: 100  Resp: 12  Temp: 98.8 F (37.1 C)  TempSrc: Oral  SpO2: 98%  Weight: 232 lb 8 oz (105.5 kg)  Height: 5\' 4"  (1.626 m)   Wt Readings from Last 3 Encounters:  02/14/22 232 lb 8 oz (105.5 kg)  05/21/21 217 lb 6 oz (98.6 kg)  04/26/21 216 lb (98 kg)  Body mass index is 39.91 kg/m. Physical Exam Vitals and nursing note reviewed.  Constitutional:      General: She is not in acute distress.    Appearance: She is well-developed.  HENT:     Head: Normocephalic and atraumatic.  Eyes:     Conjunctiva/sclera: Conjunctivae normal.  Cardiovascular:     Rate and Rhythm: Normal rate and regular rhythm.     Heart sounds: No murmur heard. Pulmonary:     Effort: Pulmonary effort is normal. No respiratory distress.     Breath sounds: Normal breath sounds.  Abdominal:     Palpations: Abdomen is soft. There is no hepatomegaly or mass.     Tenderness: There is no abdominal tenderness.  Musculoskeletal:     Comments: Deferred to podiatrist.  Skin:    General: Skin is warm.     Findings: No erythema or rash.  Neurological:     General: No focal deficit present.     Mental Status: She is alert and oriented to person, place, and time.      Cranial Nerves: No cranial nerve deficit.     Gait: Gait normal.  Psychiatric:        Mood and Affect: Mood and affect normal.   ASSESSMENT AND PLAN:  Patricia Weaver was seen today for weight gain.  Diagnoses and all orders for this visit: Lab Results  Component Value Date   HGBA1C 5.5 02/14/2022   Obesity with body mass index (BMI) of 30.0 to 39.9 Assessment & Plan: She has gained about 15 Lb since 05/2021. She understands the benefits of wt loss as well as adverse effects of obesity. Consistency with healthy diet and physical activity encouraged, this has resulted in wt loss in the  past. She is interested in establishing with wt loss clinic, referral placed.  Orders: -     Amb Ref to Medical Weight Management  Prediabetes Assessment & Plan: HgA1C improved, it went from 5.9 to 5.5. Consistency with a healthy life style encouraged for diabetes prevention. Continue Metformin same dose.  Orders: -     POCT glycosylated hemoglobin (Hb A1C)  PCOS (polycystic ovarian syndrome) Assessment & Plan: Problem is adequately controlled, she has regular menstrual periods. Continue same dose of Metformin.   Pain of both heels Assessment & Plan: Reporting improvement wearing appropriate shoe wear. Recommend arranging appt with podiatrist if problem does not resolve.   Asthma, intermittent, uncomplicated Assessment & Plan: In general she feels like problem is well controlled as far as she does avoid trigger factors. She could try Albuterol inh 2 puff 15 min before exercising outdoors. We could also add a ICS during Winter. For now she would like to continue current management.   Return in about 6 months (around 08/15/2022) for CPE. Laurelai Lepp G. [place], MD  [facility]. [place] office.

## 2022-02-14 ENCOUNTER — Ambulatory Visit (INDEPENDENT_AMBULATORY_CARE_PROVIDER_SITE_OTHER): Payer: BC Managed Care – PPO | Admitting: Family Medicine

## 2022-02-14 ENCOUNTER — Encounter (INDEPENDENT_AMBULATORY_CARE_PROVIDER_SITE_OTHER): Payer: Self-pay

## 2022-02-14 VITALS — BP 128/80 | HR 100 | Temp 98.8°F | Resp 12 | Ht 64.0 in | Wt 232.5 lb

## 2022-02-14 DIAGNOSIS — E282 Polycystic ovarian syndrome: Secondary | ICD-10-CM

## 2022-02-14 DIAGNOSIS — E669 Obesity, unspecified: Secondary | ICD-10-CM

## 2022-02-14 DIAGNOSIS — J4520 Mild intermittent asthma, uncomplicated: Secondary | ICD-10-CM

## 2022-02-14 DIAGNOSIS — R7303 Prediabetes: Secondary | ICD-10-CM

## 2022-02-14 DIAGNOSIS — M79672 Pain in left foot: Secondary | ICD-10-CM

## 2022-02-14 DIAGNOSIS — M79671 Pain in right foot: Secondary | ICD-10-CM | POA: Diagnosis not present

## 2022-02-14 DIAGNOSIS — E785 Hyperlipidemia, unspecified: Secondary | ICD-10-CM

## 2022-02-14 LAB — POCT GLYCOSYLATED HEMOGLOBIN (HGB A1C): Hemoglobin A1C: 5.5 % (ref 4.0–5.6)

## 2022-02-14 NOTE — Patient Instructions (Addendum)
A few things to remember from today's visit:  Obesity with body mass index (BMI) of 30.0 to 39.9 - Plan: Amb Ref to Medical Weight Management  Prediabetes - Plan: POC HgB A1c  PCOS (polycystic ovarian syndrome)  We can check cholesterol next visit. Appt with wt loss clinic will be arrange. If plantar fascitis/heel pain continues, arrange appt with foot specialist. No changes in asthma medication.  If you need refills for medications you take chronically, please call your pharmacy. Do not use My Chart to request refills or for acute issues that need immediate attention. If you send a my chart message, it may take a few days to be addressed, specially if I am not in the office.  Please be sure medication list is accurate. If a new problem present, please set up appointment sooner than planned today.

## 2022-02-17 ENCOUNTER — Encounter: Payer: Self-pay | Admitting: Bariatrics

## 2022-02-17 ENCOUNTER — Encounter: Payer: BC Managed Care – PPO | Admitting: Bariatrics

## 2022-02-17 ENCOUNTER — Ambulatory Visit (INDEPENDENT_AMBULATORY_CARE_PROVIDER_SITE_OTHER): Payer: BC Managed Care – PPO | Admitting: Bariatrics

## 2022-02-17 VITALS — BP 128/91 | HR 115 | Temp 97.9°F | Ht 64.0 in | Wt 227.0 lb

## 2022-02-17 DIAGNOSIS — E669 Obesity, unspecified: Secondary | ICD-10-CM | POA: Diagnosis not present

## 2022-02-17 DIAGNOSIS — R7303 Prediabetes: Secondary | ICD-10-CM

## 2022-02-17 DIAGNOSIS — E282 Polycystic ovarian syndrome: Secondary | ICD-10-CM | POA: Diagnosis not present

## 2022-02-17 DIAGNOSIS — Z0289 Encounter for other administrative examinations: Secondary | ICD-10-CM

## 2022-02-17 DIAGNOSIS — R5383 Other fatigue: Secondary | ICD-10-CM | POA: Diagnosis not present

## 2022-02-17 DIAGNOSIS — Z6841 Body Mass Index (BMI) 40.0 and over, adult: Secondary | ICD-10-CM | POA: Insufficient documentation

## 2022-02-18 NOTE — Assessment & Plan Note (Signed)
HgA1C improved, it went from 5.9 to 5.5. Consistency with a healthy life style encouraged for diabetes prevention. Continue Metformin same dose.

## 2022-02-18 NOTE — Assessment & Plan Note (Signed)
She has gained about 15 Lb since 05/2021. She understands the benefits of wt loss as well as adverse effects of obesity. Consistency with healthy diet and physical activity encouraged, this has resulted in wt loss in the past. She is interested in establishing with wt loss clinic, referral placed.

## 2022-02-18 NOTE — Assessment & Plan Note (Signed)
Reporting improvement wearing appropriate shoe wear. Recommend arranging appt with podiatrist if problem does not resolve.

## 2022-02-18 NOTE — Assessment & Plan Note (Signed)
In general she feels like problem is well controlled as far as she does avoid trigger factors. She could try Albuterol inh 2 puff 15 min before exercising outdoors. We could also add a ICS during Winter. For now she would like to continue current management.

## 2022-02-18 NOTE — Assessment & Plan Note (Signed)
Problem is adequately controlled, she has regular menstrual periods. Continue same dose of Metformin.

## 2022-02-28 NOTE — Progress Notes (Signed)
Office: [phone]  /  Fax: [phone]   Initial Visit  Patricia Weaver was seen in clinic today to evaluate for obesity. She is interested in losing weight to improve overall health and reduce the risk of weight related complications. She presents today to review program treatment options, initial physical assessment, and evaluation.     She was referred by: PCP  When asked what else they would like to accomplish? She states: Adopt healthier eating patterns and Improve quality of life  When asked how has your weight affected you? She states: Having fatigue  Contributing factors: Family history  Weight promoting medications identified: Contraceptives or hormonal therapy  Current nutrition plan: Portion control / smart choices  Current level of physical activity: Walking and Other: Asthma (cold triggered)  Current or previous pharmacotherapy: Metformin  Response to medication: Never tried medications   Past medical history includes:   Past Medical History:  Diagnosis Date   Asthma    Dysmenorrhea    GERD (gastroesophageal reflux disease)    Headache    migraines   History of chicken pox    PCOS (polycystic ovarian syndrome)      Objective:   BP (!) 128/91   Pulse (!) 115   Temp 97.9 F (36.6 C)   Ht 5\' 4"  (1.626 m)   LMP 01/31/2022   SpO2 97%   BMI 39.91 kg/m  She was weighed on the bioimpedance scale: Body mass index is 39.91 kg/m. Visceral Fat Rating:12, Body Fat%:46.3.  General:  Alert, oriented and cooperative. Patient is in no acute distress.  Respiratory: Normal respiratory effort, no problems with respiration noted  Extremities: Normal range of motion.    Mental Status: Normal mood and affect. Normal behavior. Normal judgment and thought content.   Assessment and Plan:  1. Prediabetes Patient is currently taking metformin.  She will continue her medications, and she will work on decreasing carbohydrates (sweets and starches).  2. Other  fatigue Patient notes fatigue with certain activities.   EKG and IC will be obtained at her initial visit.   - EKG 12-Lead; Future  3. PCOS (polycystic ovarian syndrome) Patient is currently taking metformin.  She will continue to follow-up with her PCP.   4. Obesity, Current  BMI 40.3 She will return to our clinic in 2 weeks for IC and EKG.    We reviewed weight, biometrics, associated medical conditions and contributing factors with patient. She would benefit from weight loss therapy via a modified calorie, low-carb, high-protein nutritional plan tailored to their REE (resting energy expenditure) which will be determined by indirect calorimetry.  We will also assess for cardiometabolic risk and nutritional derangements via fasting serologies at her next appointment.      Obesity Treatment / Action Plan:  Patient will work on garnering support from family and friends to begin weight loss journey. Will work on eliminating or reducing the presence of highly palatable, calorie dense foods in the home. Will complete provided nutritional and psychosocial assessment questionnaire before the next appointment. Will be scheduled for indirect calorimetry to determine resting energy expenditure in a fasting state.  This will allow [place] to create a reduced calorie, high-protein meal plan to promote loss of fat mass while preserving muscle mass. Will avoid skipping meals which may result in increased hunger signals and overeating at certain times. Will reduce the frequency of eating out and making healthier choices by advanced menu planning. Will work on reading labels, making healthier choices and watching portion sizes. Will  work on Designer, fashion/clothing intake with a goal of 125 ounces for men and 91 ounces for women.   Obesity Education Performed Today:  She was weighed on the bioimpedance scale and results were discussed and documented in the synopsis.  We discussed obesity as a disease and the  importance of a more detailed evaluation of all the factors contributing to the disease.  We discussed the importance of long term lifestyle changes which include nutrition, exercise and behavioral modifications as well as the importance of customizing this to her specific health and social needs.  We discussed the benefits of reaching a healthier weight to alleviate the symptoms of existing conditions and reduce the risks of the biomechanical, metabolic and psychological effects of obesity.  Patricia Weaver appears to be in the action stage of change and states they are ready to start intensive lifestyle modifications and behavioral modifications.   Reviewed by clinician on day of visit: allergies, medications, problem list, medical history, surgical history, family history, social history, and previous encounter notes.   Wilhemena Durie, am acting as transcriptionist for [employer], DO   I have reviewed the above documentation for accuracy and completeness, and I agree with the above. Jearld Lesch, DO

## 2022-03-08 ENCOUNTER — Encounter: Payer: Self-pay | Admitting: Bariatrics

## 2022-03-15 ENCOUNTER — Encounter: Payer: Self-pay | Admitting: Bariatrics

## 2022-03-15 ENCOUNTER — Ambulatory Visit (INDEPENDENT_AMBULATORY_CARE_PROVIDER_SITE_OTHER): Payer: BC Managed Care – PPO | Admitting: Bariatrics

## 2022-03-15 VITALS — BP 128/87 | HR 85 | Temp 98.2°F | Ht 64.0 in | Wt 229.0 lb

## 2022-03-15 DIAGNOSIS — R0602 Shortness of breath: Secondary | ICD-10-CM

## 2022-03-15 DIAGNOSIS — R5383 Other fatigue: Secondary | ICD-10-CM

## 2022-03-15 DIAGNOSIS — Z6839 Body mass index (BMI) 39.0-39.9, adult: Secondary | ICD-10-CM

## 2022-03-15 DIAGNOSIS — E559 Vitamin D deficiency, unspecified: Secondary | ICD-10-CM | POA: Diagnosis not present

## 2022-03-15 DIAGNOSIS — K760 Fatty (change of) liver, not elsewhere classified: Secondary | ICD-10-CM

## 2022-03-15 DIAGNOSIS — Z0000 Encounter for general adult medical examination without abnormal findings: Secondary | ICD-10-CM | POA: Diagnosis not present

## 2022-03-15 DIAGNOSIS — R7303 Prediabetes: Secondary | ICD-10-CM | POA: Diagnosis not present

## 2022-03-15 DIAGNOSIS — E785 Hyperlipidemia, unspecified: Secondary | ICD-10-CM | POA: Diagnosis not present

## 2022-03-15 DIAGNOSIS — Z1331 Encounter for screening for depression: Secondary | ICD-10-CM

## 2022-03-15 DIAGNOSIS — E7849 Other hyperlipidemia: Secondary | ICD-10-CM

## 2022-03-15 DIAGNOSIS — E538 Deficiency of other specified B group vitamins: Secondary | ICD-10-CM

## 2022-03-15 DIAGNOSIS — E669 Obesity, unspecified: Secondary | ICD-10-CM

## 2022-03-17 ENCOUNTER — Encounter (INDEPENDENT_AMBULATORY_CARE_PROVIDER_SITE_OTHER): Payer: Self-pay | Admitting: Bariatrics

## 2022-03-17 DIAGNOSIS — E88819 Insulin resistance, unspecified: Secondary | ICD-10-CM | POA: Insufficient documentation

## 2022-03-17 DIAGNOSIS — E786 Lipoprotein deficiency: Secondary | ICD-10-CM | POA: Insufficient documentation

## 2022-03-17 LAB — LIPID PANEL WITH LDL/HDL RATIO
Cholesterol, Total: 215 mg/dL — ABNORMAL HIGH (ref 100–199)
HDL: 37 mg/dL — ABNORMAL LOW (ref >39)
LDL Chol Calc (NIH): 127 mg/dL — ABNORMAL HIGH (ref 0–99)
LDL/HDL Ratio: 3.4 ratio — ABNORMAL HIGH (ref 0.0–3.2)
Triglycerides: 289 mg/dL — ABNORMAL HIGH (ref 0–149)
VLDL Cholesterol Cal: 51 mg/dL — ABNORMAL HIGH (ref 5–40)

## 2022-03-17 LAB — COMPREHENSIVE METABOLIC PANEL
ALT: 46 IU/L — ABNORMAL HIGH (ref 0–32)
AST: 36 IU/L (ref 0–40)
Albumin/Globulin Ratio: 1.7 (ref 1.2–2.2)
Albumin: 4.6 g/dL (ref 3.9–4.9)
Alkaline Phosphatase: 59 IU/L (ref 44–121)
BUN/Creatinine Ratio: 11 (ref 9–23)
BUN: 8 mg/dL (ref 6–20)
Bilirubin Total: 0.2 mg/dL (ref 0.0–1.2)
CO2: 21 mmol/L (ref 20–29)
Calcium: 9.5 mg/dL (ref 8.7–10.2)
Chloride: 102 mmol/L (ref 96–106)
Creatinine, Ser: 0.71 mg/dL (ref 0.57–1.00)
Globulin, Total: 2.7 g/dL (ref 1.5–4.5)
Glucose: 90 mg/dL (ref 70–99)
Potassium: 4.6 mmol/L (ref 3.5–5.2)
Sodium: 139 mmol/L (ref 134–144)
Total Protein: 7.3 g/dL (ref 6.0–8.5)
eGFR: 114 mL/min/{1.73_m2} (ref >59)

## 2022-03-17 LAB — VITAMIN D 25 HYDROXY (VIT D DEFICIENCY, FRACTURES): Vit D, 25-Hydroxy: 14.7 ng/mL — ABNORMAL LOW (ref 30.0–100.0)

## 2022-03-17 LAB — TSH+T4F+T3FREE
Free T4: 1.14 ng/dL (ref 0.82–1.77)
T3, Free: 3.5 pg/mL (ref 2.0–4.4)
TSH: 1.31 u[IU]/mL (ref 0.450–4.500)

## 2022-03-17 LAB — VITAMIN B12: Vitamin B-12: 442 pg/mL (ref 232–1245)

## 2022-03-17 LAB — INSULIN, RANDOM: INSULIN: 23.6 u[IU]/mL (ref 2.6–24.9)

## 2022-03-29 ENCOUNTER — Encounter: Payer: Self-pay | Admitting: Bariatrics

## 2022-03-29 ENCOUNTER — Ambulatory Visit: Payer: BC Managed Care – PPO | Admitting: Bariatrics

## 2022-03-29 NOTE — Progress Notes (Signed)
Chief Complaint:   OBESITY Patricia Weaver (MR# [id number]) is a 37 y.o. female who presents for evaluation and treatment of obesity and related comorbidities. Current BMI is Body mass index is 39.31 kg/m. Patricia Weaver has been struggling with her weight for many years and has been unsuccessful in either losing weight, maintaining weight loss, or reaching her healthy weight goal.  Patricia Weaver had an information session on 02/17/2022. She is here today for her initial visit. She is a good water drinker per the patient, and she does not like to cook due to planning time, etc.   Patricia Weaver is currently in the action stage of change and ready to dedicate time achieving and maintaining a healthier weight. Patricia Weaver is interested in becoming our patient and working on intensive lifestyle modifications including (but not limited to) diet and exercise for weight loss.  Patricia Weaver's habits were reviewed today and are as follows: Her family eats meals together, she thinks her family will eat healthier with her, her desired weight loss is 49 lbs, she has been heavy most of her life, she started gaining weight in her 20's, her heaviest weight ever was 240 pounds, she is a picky eater and doesn't like to eat healthier foods, she has significant food cravings issues, she skips meals frequently, she is frequently drinking liquids with calories, she frequently makes poor food choices, she frequently eats larger portions than normal, and she struggles with emotional eating.  Depression Screen Patricia Weaver's Food and Mood (modified PHQ-9) score was 4.  Subjective:   1. Other fatigue Patricia Weaver admits to daytime somnolence and denies waking up still tired. Patient has a history of symptoms of daytime fatigue and morning headache. Patricia Weaver generally gets 7 or 8 hours of sleep per night, and states that she has generally restful sleep. Snoring is not present. Apneic episodes are not present. Epworth Sleepiness Score is 2.   2. SOB (shortness of breath)  on exertion Patricia Weaver notes increasing shortness of breath with exercising and seems to be worsening over time with weight gain. She notes getting out of breath sooner with activity than she used to. This has not gotten worse recently. [name] denies shortness of breath at rest or orthopnea.  3. Prediabetes Patricia Weaver is taking metformin. Last A1c was 5.9 in March 2023; A1c was 5.5 one month ago. Patient has a history of polycystic ovarian syndrome.   4. Other hyperlipidemia Patricia Weaver is taking Questran (gallbladder). Last triglycerides was 213, VLDL 42.6, and HDL 37.20.  5. Health care maintenance Given obesity.   6. Vitamin D deficiency Patricia Weaver is not on Vitamin D supplementation.   7. 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   8. Fatty liver Patricia Weaver had an ultrasound approximately in 2018.  Assessment/Plan:   1. Other fatigue Patricia Weaver does feel that her weight is causing her energy to be lower than it should be. Fatigue may be related to obesity, depression or many other causes. Labs will be ordered, and in the meanwhile, Patricia Weaver will focus on self care including making healthy food choices, increasing physical activity and focusing on stress reduction.  - EKG 12-Lead - TSH+T4F+T3Free  2. SOB (shortness of breath) on exertion Patricia Weaver does feel that she gets out of breath more easily that she used to when she exercises. Patricia Weaver's shortness of breath appears to be obesity related and exercise induced. She has agreed to work on weight loss and gradually increase exercise to treat her exercise induced shortness  of breath. Will continue to monitor closely.  3. Prediabetes We will check labs today. Patricia Weaver will continue metformin and exercise, and she will work on her meal plan.   - Insulin, random  4. Other hyperlipidemia We will check labs today. 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   - Lipid Panel With LDL/HDL Ratio  5. Health  care maintenance We will check labs today. EKG and IC were reviewed with the patient today.   - Lipid Panel With LDL/HDL Ratio - Insulin, random - TSH+T4F+T3Free - VITAMIN D 25 Hydroxy (Vit-D Deficiency, Fractures) - Vitamin B12 - Comprehensive metabolic panel  6. Vitamin D deficiency We will check labs today, and we will follow-up at Patricia Weaver's next visit.   - VITAMIN D 25 Hydroxy (Vit-D Deficiency, Fractures)  7. B12 deficiency We will check labs today, and we will follow-up at Patricia Weaver's next visit.   - Vitamin B12  8. Fatty liver Patricia Weaver will work on her meal plan and exercise.   9. Depression screening Patricia Weaver had a negative depression screening.   10. Generalized obesity  11. BMI 39.0-39.9,adult Patricia Weaver is currently in the action stage of change and her goal is to continue with weight loss efforts. I recommend Patricia Weaver begin the structured treatment plan as follows:  She has agreed to the Category 3 Plan.  She will begin her meal plan. Meal planning was discussed. Reviewed labs from 04/26/2021, CMP, lipid, B12, A1c, and glucose. She will work on not skipping meals.  Exercise goals: No exercise has been prescribed at this time.   Behavioral modification strategies: increasing lean protein intake, decreasing simple carbohydrates, increasing vegetables, increasing water intake, decreasing eating out, no skipping meals, meal planning and cooking strategies, keeping healthy foods in the home, avoiding temptations, and planning for success.  She was informed of the importance of frequent follow-up visits to maximize her success with intensive lifestyle modifications for her multiple health conditions. She was informed we would discuss her lab results at her next visit unless there is a critical issue that needs to be addressed sooner. Patricia Weaver agreed to keep her next visit at the agreed upon time to discuss these results.  Objective:   Blood pressure 128/87, pulse 85, temperature 98.2 F  (36.8 C), height '5\' 4"'$  (1.626 m), weight 229 lb (103.9 kg), last menstrual period 01/31/2022, SpO2 99 %. Body mass index is 39.31 kg/m.  EKG: Normal sinus rhythm, rate 83 BPM.  Indirect Calorimeter completed today shows a VO2 of 292 and a REE of 2016.  Her calculated basal metabolic rate is [id number] thus her basal metabolic rate is better than expected.  General: Cooperative, alert, well developed, in no acute distress. HEENT: Conjunctivae and lids unremarkable. Cardiovascular: Regular rhythm.  Lungs: Normal work of breathing. Neurologic: No focal deficits.   Lab Results  Component Value Date   CREATININE 0.71 03/15/2022   BUN 8 03/15/2022   NA 139 03/15/2022   K 4.6 03/15/2022   CL 102 03/15/2022   CO2 21 03/15/2022   Lab Results  Component Value Date   ALT 46 (H) 03/15/2022   AST 36 03/15/2022   ALKPHOS 59 03/15/2022   BILITOT 0.2 03/15/2022   Lab Results  Component Value Date   HGBA1C 5.5 02/14/2022   HGBA1C 5.9 04/26/2021   HGBA1C 5.8 02/26/2020   HGBA1C 5.8 07/29/2019   Lab Results  Component Value Date   INSULIN 23.6 03/15/2022   Lab Results  Component Value Date   TSH 1.310 03/15/2022  Lab Results  Component Value Date   CHOL 215 (H) 03/15/2022   HDL 37 (L) 03/15/2022   LDLCALC 127 (H) 03/15/2022   LDLDIRECT 123.0 04/26/2021   TRIG 289 (H) 03/15/2022   CHOLHDL 5 04/26/2021   Lab Results  Component Value Date   WBC 6.1 10/05/2016   HGB 12.0 10/05/2016   HCT 36.8 10/05/2016   MCV 84.8 10/05/2016   PLT 263 10/05/2016   No results found for: "IRON", "TIBC", "FERRITIN"  Attestation Statements:   Reviewed by clinician on day of visit: allergies, medications, problem list, medical history, surgical history, family history, social history, and previous encounter notes.   Wilhemena Durie, am acting as Location manager for [employer], DO.  I have reviewed the above documentation for accuracy and completeness, and I agree with the above. Jearld Lesch, DO

## 2022-04-14 ENCOUNTER — Other Ambulatory Visit: Payer: Self-pay | Admitting: Family Medicine

## 2022-04-14 DIAGNOSIS — E282 Polycystic ovarian syndrome: Secondary | ICD-10-CM

## 2022-07-13 ENCOUNTER — Other Ambulatory Visit: Payer: Self-pay | Admitting: Family Medicine

## 2022-07-13 DIAGNOSIS — K915 Postcholecystectomy syndrome: Secondary | ICD-10-CM

## 2022-08-31 ENCOUNTER — Ambulatory Visit (INDEPENDENT_AMBULATORY_CARE_PROVIDER_SITE_OTHER): Payer: BC Managed Care – PPO | Admitting: Family Medicine

## 2022-08-31 ENCOUNTER — Encounter: Payer: Self-pay | Admitting: Family Medicine

## 2022-08-31 VITALS — BP 126/80 | HR 75 | Temp 98.9°F | Resp 12 | Ht 64.0 in | Wt 233.5 lb

## 2022-08-31 DIAGNOSIS — H9202 Otalgia, left ear: Secondary | ICD-10-CM | POA: Diagnosis not present

## 2022-08-31 DIAGNOSIS — H60503 Unspecified acute noninfective otitis externa, bilateral: Secondary | ICD-10-CM

## 2022-08-31 MED ORDER — CIPROFLOXACIN-DEXAMETHASONE 0.3-0.1 % OT SUSP: 4.0000 [drp] | Freq: Two times a day (BID) | OTIC | 0 refills | Status: AC

## 2022-08-31 NOTE — Patient Instructions (Addendum)
A few things to remember from today's visit:  Acute noninfective otitis externa of both ears, unspecified type - Plan: ciprofloxacin-dexamethasone (CIPRODEX) OTIC suspension Apply 3-4 drops in each ear 2 times daily for 10 days. Keep ears dry. Monitor for new symptoms.  If you need refills for medications you take chronically, please call your pharmacy. Do not use My Chart to request refills or for acute issues that need immediate attention. If you send a my chart message, it may take a few days to be addressed, specially if I am not in the office.  Please be sure medication list is accurate. If a new problem present, please set up appointment sooner than planned today.

## 2022-08-31 NOTE — Progress Notes (Signed)
ACUTE VISIT Chief Complaint  Patient presents with   Ear Pain    Left ear, started with itching and then turned into pain. Went away for a week & then came back. Having itching in the right ear.    HPI: [name] is a 37 y.o. female, who is here today complaining of left ear ache and bilateral ear canal pruritus as described above. Started with pruritus in the left ear which then progressed to pain, she has been scratching ear.The earache initially went away for a week but has since returned  Otalgia  There is pain in the left ear. This is a new problem. The current episode started 1 to 4 weeks ago. The problem has been waxing and waning. There has been no fever. The pain is at a severity of 9/10. The pain is moderate. Associated symptoms include ear discharge. Pertinent negatives include no abdominal pain, coughing, diarrhea, headaches, hearing loss, neck pain, rash, rhinorrhea, sore throat or vomiting.   Describes a sensation of "wetness" and fluid in both ears  Denies any recent changes in hair products that could have caused the ear symptoms. No recent travel or URI.  She has been swimming but not before the onset of the symptoms. Went swimming two days ago. She has tried over-the-counter hydrogen peroxide for relief, which has helped to some extent.  PMH: Mild asthma  Denies any pruritus or lesions on the scalp. Denies any changes in hearing or dizziness associated with the ear symptoms.  Review of Systems  Constitutional:  Negative for activity change, appetite change and chills.  HENT:  Positive for ear discharge and ear pain. Negative for hearing loss, rhinorrhea and sore throat.   Respiratory:  Negative for cough, shortness of breath and wheezing.   Gastrointestinal:  Negative for abdominal pain, diarrhea and vomiting.  Musculoskeletal:  Negative for neck pain.  Skin:  Negative for rash.  Allergic/Immunologic: Positive for environmental allergies.  Neurological:   Negative for syncope, weakness and headaches.  See other pertinent positives and negatives in HPI.  Current Outpatient Medications on File Prior to Visit  Medication Sig Dispense Refill   Cetirizine HCl (ZYRTEC ALLERGY PO) Take 1 tablet by mouth daily.     cholestyramine (QUESTRAN) 4 g packet MIX AND DRINK 1 PACKET (4 GRAMS TOTAL) DAILY 90 packet 3   ibuprofen (ADVIL,MOTRIN) 200 MG tablet Take 400 mg by mouth daily as needed for headache or moderate pain.     metFORMIN (GLUCOPHAGE-XR) 500 MG 24 hr tablet TAKE 1 TABLET TWICE A DAY WITH MEALS 180 tablet 3   No current facility-administered medications on file prior to visit.   Past Medical History:  Diagnosis Date   Asthma    Dysmenorrhea    GERD (gastroesophageal reflux disease)    Headache    migraines   History of chicken pox    IBS (irritable bowel syndrome)    PCOS (polycystic ovarian syndrome)    Pre-diabetes    Vitamin B 12 deficiency    No Known Allergies  Social History   Socioeconomic History   Marital status: Married    Spouse name: Not on file   Number of children: Not on file   Years of education: 1   Highest education level: 12th grade  Occupational History   Not on file  Tobacco Use   Smoking status: Never   Smokeless tobacco: Never  Vaping Use   Vaping status: Never Used  Substance and Sexual Activity   Alcohol  use: No   Drug use: No   Sexual activity: Yes    Partners: Male  Other Topics Concern   Not on file  Social History Narrative   Not on file   Social Determinants of Health   Financial Resource Strain: Low Risk  (02/14/2022)   Overall Financial Resource Strain (CARDIA)    Difficulty of Paying Living Expenses: Not hard at all  Food Insecurity: No Food Insecurity (02/14/2022)   Hunger Vital Sign    Worried About Running Out of Food in the Last Year: Never true    Ran Out of Food in the Last Year: Never true  Transportation Needs: No Transportation Needs (02/14/2022)   PRAPARE -  Administrator, Civil Service (Medical): No    Lack of Transportation (Non-Medical): No  Physical Activity: Insufficiently Active (02/14/2022)   Exercise Vital Sign    Days of Exercise per Week: 2 days    Minutes of Exercise per Session: 20 min  Stress: No Stress Concern Present (02/14/2022)   [employer] of Occupational Health - Occupational Stress Questionnaire    Feeling of Stress : Not at all  Social Connections: Moderately Integrated (02/14/2022)   Social Connection and Isolation Panel [NHANES]    Frequency of Communication with Friends and Family: More than three times a week    Frequency of Social Gatherings with Friends and Family: Three times a week    Attends Religious Services: Never    Active Member of Clubs or Organizations: Yes    Attends Banker Meetings: More than 4 times per year    Marital Status: Married   Vitals:   08/31/22 1227  BP: 126/80  Pulse: 75  Resp: 12  Temp: 98.9 F (37.2 C)  SpO2: 98%   Body mass index is 40.08 kg/m.  Physical Exam Vitals and nursing note reviewed.  Constitutional:      General: She is not in acute distress.    Appearance: She is well-developed. She is not ill-appearing.  HENT:     Head: Normocephalic and atraumatic.     Right Ear: Tympanic membrane and external ear normal. No tenderness.     Left Ear: Tympanic membrane and external ear normal. No tenderness.     Ears:     Comments: Left ear with whitish material occupying part of ear canal, ? Cerumen. TM seen partially. No erythema or tenderness. Right ear canal with similar whitish material but minimal amount on skin. No active drainage.    Mouth/Throat:     Mouth: Mucous membranes are moist.     Pharynx: Oropharynx is clear.  Eyes:     Conjunctiva/sclera: Conjunctivae normal.  Cardiovascular:     Rate and Rhythm: Normal rate and regular rhythm.     Heart sounds: No murmur heard. Pulmonary:     Effort: Pulmonary effort is normal. No  respiratory distress.     Breath sounds: Normal breath sounds. No stridor.  Lymphadenopathy:     Head:     Right side of head: No preauricular or posterior auricular adenopathy.     Left side of head: No preauricular or posterior auricular adenopathy.     Cervical: No cervical adenopathy.  Skin:    General: Skin is warm.     Findings: No erythema or rash.  Neurological:     Mental Status: She is alert and oriented to person, place, and time.     Cranial Nerves: No cranial nerve deficit.  Psychiatric:  Mood and Affect: Mood and affect normal.   ASSESSMENT AND PLAN:  Ms. Carolyne Fiscal was seen today for earache and pruritus.  Acute noninfective otitis externa of both ears, unspecified type ? Eczema. Recommend topical Ciprodex for 7 to 10 days. Monitor for new symptoms. Instructed to keep the ear dry, so avoid swimming until problem resolves.  -     Ciprofloxacin-dexAMETHasone; Place 4 drops into both ears 2 (two) times daily for 10 days.  Dispense: 7.5 mL; Refill: 0  Left ear pain We discussed possible etiologies, clinical findings do not suggest an infectious process.?  Irritation from scratching. Ciprodex 3 to 4 drops twice daily for 7 to 10-day recommended. Instructed about warning signs. Follow-up as needed.  -     Ciprofloxacin-dexAMETHasone; Place 4 drops into both ears 2 (two) times daily for 10 days.  Dispense: 7.5 mL; Refill: 0  Return if symptoms worsen or fail to improve, for keep next appointment.  Alyse Kathan G. [place], MD  [facility]. [place] office.

## 2023-01-15 DIAGNOSIS — M62838 Other muscle spasm: Secondary | ICD-10-CM | POA: Diagnosis not present

## 2023-03-27 ENCOUNTER — Encounter: Payer: Self-pay | Admitting: Family Medicine

## 2023-03-27 ENCOUNTER — Ambulatory Visit (INDEPENDENT_AMBULATORY_CARE_PROVIDER_SITE_OTHER): Payer: BC Managed Care – PPO | Admitting: Family Medicine

## 2023-03-27 VITALS — BP 128/70 | HR 94 | Temp 98.3°F | Ht 64.0 in | Wt 239.0 lb

## 2023-03-27 DIAGNOSIS — Z1329 Encounter for screening for other suspected endocrine disorder: Secondary | ICD-10-CM

## 2023-03-27 DIAGNOSIS — Z0000 Encounter for general adult medical examination without abnormal findings: Secondary | ICD-10-CM

## 2023-03-27 DIAGNOSIS — Z6841 Body Mass Index (BMI) 40.0 and over, adult: Secondary | ICD-10-CM

## 2023-03-27 DIAGNOSIS — E559 Vitamin D deficiency, unspecified: Secondary | ICD-10-CM | POA: Diagnosis not present

## 2023-03-27 DIAGNOSIS — E785 Hyperlipidemia, unspecified: Secondary | ICD-10-CM

## 2023-03-27 DIAGNOSIS — E538 Deficiency of other specified B group vitamins: Secondary | ICD-10-CM

## 2023-03-27 DIAGNOSIS — E66813 Obesity, class 3: Secondary | ICD-10-CM

## 2023-03-27 DIAGNOSIS — E282 Polycystic ovarian syndrome: Secondary | ICD-10-CM

## 2023-03-27 DIAGNOSIS — Z0001 Encounter for general adult medical examination with abnormal findings: Secondary | ICD-10-CM

## 2023-03-27 DIAGNOSIS — Z130 Encounter for screening for diseases of the blood and blood-forming organs and certain disorders involving the immune mechanism: Secondary | ICD-10-CM

## 2023-03-27 DIAGNOSIS — Z13228 Encounter for screening for other metabolic disorders: Secondary | ICD-10-CM

## 2023-03-27 DIAGNOSIS — K915 Postcholecystectomy syndrome: Secondary | ICD-10-CM

## 2023-03-27 LAB — COMPREHENSIVE METABOLIC PANEL
ALT: 55 U/L — ABNORMAL HIGH (ref 0–35)
AST: 50 U/L — ABNORMAL HIGH (ref 0–37)
Albumin: 4.5 g/dL (ref 3.5–5.2)
Alkaline Phosphatase: 42 U/L (ref 39–117)
BUN: 11 mg/dL (ref 6–23)
CO2: 27 meq/L (ref 19–32)
Calcium: 9.6 mg/dL (ref 8.4–10.5)
Chloride: 100 meq/L (ref 96–112)
Creatinine, Ser: 0.8 mg/dL (ref 0.40–1.20)
GFR: 94.62 mL/min (ref >60.00)
Glucose, Bld: 98 mg/dL (ref 70–99)
Potassium: 4 meq/L (ref 3.5–5.1)
Sodium: 138 meq/L (ref 135–145)
Total Bilirubin: 0.5 mg/dL (ref 0.2–1.2)
Total Protein: 7.8 g/dL (ref 6.0–8.3)

## 2023-03-27 LAB — LIPID PANEL
Cholesterol: 204 mg/dL — ABNORMAL HIGH (ref 0–200)
HDL: 40.8 mg/dL (ref >39.00)
LDL Cholesterol: 116 mg/dL — ABNORMAL HIGH (ref 0–99)
NonHDL: 163.3
Total CHOL/HDL Ratio: 5
Triglycerides: 238 mg/dL — ABNORMAL HIGH (ref 0.0–149.0)
VLDL: 47.6 mg/dL — ABNORMAL HIGH (ref 0.0–40.0)

## 2023-03-27 LAB — VITAMIN B12: Vitamin B-12: 531 pg/mL (ref 211–911)

## 2023-03-27 LAB — VITAMIN D 25 HYDROXY (VIT D DEFICIENCY, FRACTURES): VITD: 25.27 ng/mL — ABNORMAL LOW (ref 30.00–100.00)

## 2023-03-27 LAB — HEMOGLOBIN A1C: Hgb A1c MFr Bld: 6 % (ref 4.6–6.5)

## 2023-03-27 NOTE — Progress Notes (Signed)
 HPI: Patricia Weaver is a 38 y.o. female with a PMHx significant for PCOS, HLD, status post cholecystectomy, prediabetes, asthma, Vitamin D deficiency, and B12 deficiency, who is here today for her routine physical.  Last CPE: 04/26/2021  Exercise: Patient admits she has not been exercising regularly. She says it has been cold and she has been fatigued.  Diet: She is mainly cooking at home but eats out occasionally. She eats vegetables daily. She says her appetite is slightly increased. She is not interested in seeing the weight loss clinic at this time.  Sleep: 9 hours per night.  Alcohol Use: none Smoking: never Vision: UTD on routine vision care.  Dental: UTD on routine dental care.   Immunization History  Administered Date(s) Administered   Influenza-Unspecified 10/15/2019, 10/01/2020, 11/01/2021   Moderna Covid-19 Fall Seasonal Vaccine 20yrs & older 11/01/2021   Moderna SARS-COV2 Booster Vaccination 12/17/2019   Tdap 06/03/2017   Health Maintenance  Topic Date Due   Pneumococcal Vaccine 59-59 Years old (1 of 2 - PCV) Never done   COVID-19 Vaccine (2 - 2024-25 season) 10/02/2022   HIV Screening  04/29/2032 (Originally 07/24/2001)   Cervical Cancer Screening (HPV/Pap Cotest)  05/22/2026   DTaP/Tdap/Td (2 - Td or Tdap) 06/04/2027   INFLUENZA VACCINE  Completed   Hepatitis C Screening  Completed   HPV VACCINES  Aged Out   Chronic medical problems:   Hyperlipidemia: Not currently on pharmacologic treatment.  Lab Results  Component Value Date   CHOL 215 (H) 03/15/2022   HDL 37 (L) 03/15/2022   LDLCALC 127 (H) 03/15/2022   LDLDIRECT 123.0 04/26/2021   TRIG 289 (H) 03/15/2022   CHOLHDL 5 04/26/2021   PCOS:  Currenlty on metformin 500 mg twice daily.  She used to have irregular cycles but they have been normal for awhile. She has been taking the metformin for about 15 years.  Not on birth control because her husband had a vasectomy.  LMP: 2 weeks ago.   Prediabetes:   Lab Results  Component Value Date   HGBA1C 5.5 02/14/2022   Chronic diarrhea:  She takes Questran 4 g daily for chronic diarrhea after having her cholecystectomy.   B12/vitamin D deficiency:  She takes B12 supplements about once per week. Also takes 1000 units of vitamin D daily.  Lab Results  Component Value Date   VITAMINB12 442 03/15/2022   She has not been told she is having symptoms of sleep apnea at night. She says she feels rested in the morning.   Patient reports no new problems or concerns today.   Review of Systems  Current Outpatient Medications on File Prior to Visit  Medication Sig Dispense Refill   Cetirizine HCl (ZYRTEC ALLERGY PO) Take 1 tablet by mouth daily.     cholestyramine (QUESTRAN) 4 g packet MIX AND DRINK 1 PACKET (4 GRAMS TOTAL) DAILY 90 packet 3   ibuprofen (ADVIL,MOTRIN) 200 MG tablet Take 400 mg by mouth daily as needed for headache or moderate pain.     metFORMIN (GLUCOPHAGE-XR) 500 MG 24 hr tablet TAKE 1 TABLET TWICE A DAY WITH MEALS 180 tablet 3   No current facility-administered medications on file prior to visit.   Past Medical History:  Diagnosis Date   Asthma    Dysmenorrhea    GERD (gastroesophageal reflux disease)    Headache    migraines   History of chicken pox    IBS (irritable bowel syndrome)    PCOS (polycystic ovarian syndrome)  Pre-diabetes    Vitamin B 12 deficiency    Past Surgical History:  Procedure Laterality Date   CHOLECYSTECTOMY N/A 10/05/2016   Procedure: LAPAROSCOPIC CHOLECYSTECTOMY;  Surgeon: Berna Bue, MD;  Location: [facility] OR;  Service: General;  Laterality: N/A;   WISDOM TOOTH EXTRACTION     No Known Allergies  Family History  Problem Relation Age of Onset   Hypertension Mother    Diabetes Mother    Thyroid disease Mother    Emphysema Mother    Asthma Mother    COPD Mother    High Cholesterol Mother    Depression Mother    Hypertension Father    Stroke Father    Diabetes Maternal Grandfather      Social History   Socioeconomic History   Marital status: Married    Spouse name: Not on file   Number of children: Not on file   Years of education: 1   Highest education level: 12th grade  Occupational History   Not on file  Tobacco Use   Smoking status: Never   Smokeless tobacco: Never  Vaping Use   Vaping status: Never Used  Substance and Sexual Activity   Alcohol use: No   Drug use: No   Sexual activity: Yes    Partners: Male  Other Topics Concern   Not on file  Social History Narrative   Not on file   Social Drivers of Health   Financial Resource Strain: Low Risk  (03/26/2023)   Overall Financial Resource Strain (CARDIA)    Difficulty of Paying Living Expenses: Not hard at all  Food Insecurity: No Food Insecurity (03/26/2023)   Hunger Vital Sign    Worried About Running Out of Food in the Last Year: Never true    Ran Out of Food in the Last Year: Never true  Transportation Needs: No Transportation Needs (03/26/2023)   PRAPARE - Transportation    Lack of Transportation (Medical): No    Lack of Transportation (Non-Medical): No  Physical Activity: Unknown (03/26/2023)   Exercise Vital Sign    Days of Exercise per Week: 0 days    Minutes of Exercise per Session: Not on file  Stress: No Stress Concern Present (03/26/2023)   [employer] of Occupational Health - Occupational Stress Questionnaire    Feeling of Stress : Not at all  Social Connections: Moderately Integrated (03/26/2023)   Social Connection and Isolation Panel [NHANES]    Frequency of Communication with Friends and Family: More than three times a week    Frequency of Social Gatherings with Friends and Family: More than three times a week    Attends Religious Services: Never    Database administrator or Organizations: Yes    Attends Engineer, structural: More than 4 times per year    Marital Status: Married   There were no vitals filed for this visit. There is no height or weight on file  to calculate BMI.  Wt Readings from Last 3 Encounters:  08/31/22 233 lb 8 oz (105.9 kg)  03/15/22 229 lb (103.9 kg)  02/17/22 227 lb (103 kg)   Physical Exam Vitals and nursing note reviewed.  Constitutional:      General: She is not in acute distress.    Appearance: She is well-developed.  HENT:     Head: Normocephalic and atraumatic.     Right Ear: Hearing, tympanic membrane, ear canal and external ear normal.     Left Ear: Hearing, tympanic membrane, ear canal  and external ear normal.     Mouth/Throat:     Mouth: Mucous membranes are moist.     Pharynx: Oropharynx is clear. Uvula midline.  Eyes:     Extraocular Movements: Extraocular movements intact.     Conjunctiva/sclera: Conjunctivae normal.     Pupils: Pupils are equal, round, and reactive to light.  Neck:     Thyroid: No thyroid mass or thyromegaly.     Trachea: No tracheal deviation.  Cardiovascular:     Rate and Rhythm: Normal rate and regular rhythm.     Pulses:          Dorsalis pedis pulses are 2+ on the right side and 2+ on the left side.     Heart sounds: No murmur heard. Pulmonary:     Effort: Pulmonary effort is normal. No respiratory distress.     Breath sounds: Normal breath sounds.  Abdominal:     Palpations: Abdomen is soft. There is no hepatomegaly or mass.     Tenderness: There is no abdominal tenderness.  Genitourinary:    Comments: Deferred to gyn. Musculoskeletal:     Right lower leg: No edema.     Left lower leg: No edema.     Comments: No major deformity or signs of synovitis appreciated.  Lymphadenopathy:     Cervical: No cervical adenopathy.     Upper Body:     Right upper body: No supraclavicular adenopathy.     Left upper body: No supraclavicular adenopathy.  Skin:    General: Skin is warm.     Findings: No erythema or rash.  Neurological:     General: No focal deficit present.     Mental Status: She is alert and oriented to person, place, and time.     Cranial Nerves: No cranial  nerve deficit.     Sensory: No sensory deficit.     Motor: No weakness.     Coordination: Coordination normal.     Gait: Gait normal.     Deep Tendon Reflexes:     Reflex Scores:      Bicep reflexes are 2+ on the right side and 2+ on the left side.      Patellar reflexes are 2+ on the right side and 2+ on the left side. Psychiatric:     Comments: Well groomed, good eye contact.    ASSESSMENT AND PLAN:  Patricia Weaver was here today for her annual physical examination.  No orders of the defined types were placed in this encounter.  @ASSESSPLAN @  There are no diagnoses linked to this encounter.  No follow-ups on file.  I, Rolla Etienne Wierda, acting as a scribe for Preston Garabedian [place], MD., have documented all relevant documentation on the behalf of Sylis Ketchum [place], MD, as directed by  Alexis Reber [place], MD while in the presence of Hairo Garraway [place], MD.   I, Nasya Vincent [place], MD, have reviewed all documentation for this visit. The documentation on 03/27/23 for the exam, diagnosis, procedures, and orders are all accurate and complete.  Amazing Cowman G. [place], MD  [facility]. [place] office.

## 2023-03-27 NOTE — Patient Instructions (Addendum)
 A few things to remember from today's visit:  Routine general medical examination at a health care facility  PCOS (polycystic ovarian syndrome) - Plan: Hemoglobin A1c  B12 deficiency - Plan: Vitamin B12  Hyperlipidemia, unspecified hyperlipidemia type - Plan: Comprehensive metabolic panel  Screening for endocrine, metabolic and immunity disorder - Plan: Hemoglobin A1c, Lipid panel  Vitamin D deficiency, unspecified - Plan: Comprehensive metabolic panel, VITAMIN D 25 Hydroxy (Vit-D Deficiency, Fractures)  Stop Metformin.  Do not use My Chart to request refills or for acute issues that need immediate attention. If you send a my chart message, it may take a few days to be addressed, specially if I am not in the office.  Please be sure medication list is accurate. If a new problem present, please set up appointment sooner than planned today.  Health Maintenance, Female Adopting a healthy lifestyle and getting preventive care are important in promoting health and wellness. Ask your health care provider about: The right schedule for you to have regular tests and exams. Things you can do on your own to prevent diseases and keep yourself healthy. What should I know about diet, weight, and exercise? Eat a healthy diet  Eat a diet that includes plenty of vegetables, fruits, low-fat dairy products, and lean protein. Do not eat a lot of foods that are high in solid fats, added sugars, or sodium. Maintain a healthy weight Body mass index (BMI) is used to identify weight problems. It estimates body fat based on height and weight. Your health care provider can help determine your BMI and help you achieve or maintain a healthy weight. Get regular exercise Get regular exercise. This is one of the most important things you can do for your health. Most adults should: Exercise for at least 150 minutes each week. The exercise should increase your heart rate and make you sweat (moderate-intensity  exercise). Do strengthening exercises at least twice a week. This is in addition to the moderate-intensity exercise. Spend less time sitting. Even light physical activity can be beneficial. Watch cholesterol and blood lipids Have your blood tested for lipids and cholesterol at 37 years of age, then have this test every 5 years. Have your cholesterol levels checked more often if: Your lipid or cholesterol levels are high. You are older than 37 years of age. You are at high risk for heart disease. What should I know about cancer screening? Depending on your health history and family history, you may need to have cancer screening at various ages. This may include screening for: Breast cancer. Cervical cancer. Colorectal cancer. Skin cancer. Lung cancer. What should I know about heart disease, diabetes, and high blood pressure? Blood pressure and heart disease High blood pressure causes heart disease and increases the risk of stroke. This is more likely to develop in people who have high blood pressure readings or are overweight. Have your blood pressure checked: Every 3-5 years if you are 42-40 years of age. Every year if you are 109 years old or older. Diabetes Have regular diabetes screenings. This checks your fasting blood sugar level. Have the screening done: Once every three years after age 41 if you are at a normal weight and have a low risk for diabetes. More often and at a younger age if you are overweight or have a high risk for diabetes. What should I know about preventing infection? Hepatitis B If you have a higher risk for hepatitis B, you should be screened for this virus. Talk with your health  care provider to find out if you are at risk for hepatitis B infection. Hepatitis C Testing is recommended for: Everyone born from [birth year] through 1964. Anyone with known risk factors for hepatitis C. Sexually transmitted infections (STIs) Get screened for STIs, including gonorrhea and  chlamydia, if: You are sexually active and are younger than 37 years of age. You are older than 37 years of age and your health care provider tells you that you are at risk for this type of infection. Your sexual activity has changed since you were last screened, and you are at increased risk for chlamydia or gonorrhea. Ask your health care provider if you are at risk. Ask your health care provider about whether you are at high risk for HIV. Your health care provider may recommend a prescription medicine to help prevent HIV infection. If you choose to take medicine to prevent HIV, you should first get tested for HIV. You should then be tested every 3 months for as long as you are taking the medicine. Pregnancy If you are about to stop having your period (premenopausal) and you may become pregnant, seek counseling before you get pregnant. Take 400 to 800 micrograms (mcg) of folic acid every day if you become pregnant. Ask for birth control (contraception) if you want to prevent pregnancy. Osteoporosis and menopause Osteoporosis is a disease in which the bones lose minerals and strength with aging. This can result in bone fractures. If you are 64 years old or older, or if you are at risk for osteoporosis and fractures, ask your health care provider if you should: Be screened for bone loss. Take a calcium or vitamin D supplement to lower your risk of fractures. Be given hormone replacement therapy (HRT) to treat symptoms of menopause. Follow these instructions at home: Alcohol use Do not drink alcohol if: Your health care provider tells you not to drink. You are pregnant, may be pregnant, or are planning to become pregnant. If you drink alcohol: Limit how much you have to: 0-1 drink a day. Know how much alcohol is in your drink. In the [place] one drink equals one 12 oz bottle of beer (355 mL), one 5 oz glass of wine (148 mL), or one 1 oz glass of hard liquor (44 mL). Lifestyle Do not use any  products that contain nicotine or tobacco. These products include cigarettes, chewing tobacco, and vaping devices, such as e-cigarettes. If you need help quitting, ask your health care provider. Do not use street drugs. Do not share needles. Ask your health care provider for help if you need support or information about quitting drugs. General instructions Schedule regular health, dental, and eye exams. Stay current with your vaccines. Tell your health care provider if: You often feel depressed. You have ever been abused or do not feel safe at home. Summary Adopting a healthy lifestyle and getting preventive care are important in promoting health and wellness. Follow your health care provider's instructions about healthy diet, exercising, and getting tested or screened for diseases. Follow your health care provider's instructions on monitoring your cholesterol and blood pressure. This information is not intended to replace advice given to you by your health care provider. Make sure you discuss any questions you have with your health care provider. Document Revised: 06/08/2020 Document Reviewed: 06/08/2020 Elsevier Patient Education  2024 [employer].

## 2023-03-27 NOTE — Assessment & Plan Note (Signed)
 Problem has been well-controlled with Questran 4 g one half package daily, so no changes. Continue annual follow-up.

## 2023-03-27 NOTE — Assessment & Plan Note (Signed)
 She has been on metformin for about 15 years, having regular menses.   She agrees with stopping medication, will resume if menstrual irregularities reoccur. Follow-up in 3 to 4 months.

## 2023-03-27 NOTE — Assessment & Plan Note (Signed)
 Continue nonpharmacologic treatment. Further recommendation will be given according to lipid panel result.

## 2023-03-30 ENCOUNTER — Encounter: Payer: Self-pay | Admitting: Family Medicine

## 2023-04-10 ENCOUNTER — Other Ambulatory Visit: Payer: Self-pay | Admitting: Family Medicine

## 2023-04-10 DIAGNOSIS — E282 Polycystic ovarian syndrome: Secondary | ICD-10-CM

## 2023-05-25 ENCOUNTER — Ambulatory Visit: Admitting: Orthopedic Surgery

## 2023-05-29 ENCOUNTER — Ambulatory Visit (INDEPENDENT_AMBULATORY_CARE_PROVIDER_SITE_OTHER): Payer: Self-pay | Admitting: Podiatry

## 2023-05-29 ENCOUNTER — Ambulatory Visit (INDEPENDENT_AMBULATORY_CARE_PROVIDER_SITE_OTHER)

## 2023-05-29 DIAGNOSIS — M722 Plantar fascial fibromatosis: Secondary | ICD-10-CM

## 2023-05-29 MED ORDER — DIFLUNISAL 500 MG PO TABS: 500.0000 mg | ORAL_TABLET | Freq: Two times a day (BID) | ORAL | 2 refills | Status: AC

## 2023-05-29 NOTE — Patient Instructions (Signed)

## 2023-05-29 NOTE — Progress Notes (Unsigned)
 Chief Complaint  Patient presents with   Plantar Fasciitis    Bilateral foot pain pt stated that the pain is sharp and is in her arches    HPI: 38 y.o. female presenting today with c/o pain in the bottom of the bilateral arch/heel.  Denies injury or bruising.  States that this has been present for approximately 2 years.  She does prefer to wear sandals in the summertime.  Past Medical History:  Diagnosis Date   Asthma    Dysmenorrhea    GERD (gastroesophageal reflux disease)    Headache    migraines   History of chicken pox    IBS (irritable bowel syndrome)    PCOS (polycystic ovarian syndrome)    Pre-diabetes    Vitamin B 12 deficiency     Past Surgical History:  Procedure Laterality Date   CHOLECYSTECTOMY N/A 10/05/2016   Procedure: LAPAROSCOPIC CHOLECYSTECTOMY;  Surgeon: Adalberto Acton, MD;  Location: [facility] OR;  Service: General;  Laterality: N/A;   WISDOM TOOTH EXTRACTION      No Known Allergies   Physical Exam: General: The patient is alert and oriented x3 in no acute distress.  Dermatology:  No ecchymosis, erythema, or edema bilateral.  No open lesions.    Vascular: Palpable pedal pulses bilaterally. Capillary refill within normal limits.  No appreciable edema.    Neurological: Light touch sensation intact bilateral.  MMT 5/5 to lower extremity bilateral. Negative Tinel's sign with percussion of the posterior tibial nerve on the affected extremity.    Musculoskeletal Exam:  There is pain on palpation of the plantarmedial & plantarcentral aspect of bilateral arch/heel.  No gaps or nodules within the plantar fascia.  Positive Windlass mechanism bilateral.  Antalgic gait noted with first few steps upon standing.  No pain on palpation of achilles tendon bilateral.  Ankle df less than 10 degrees with knee extended b/l.  Patient has a flexible high arch with bilateral  Radiographic Exam (bilateral foot, 3 weightbearing views, 05/29/2023):  Normal osseous mineralization.  Joint spaces preserved.  No fractures noted.  Mild increase in first intermetatarsal angle bilateral.  Assessment/Plan of Care: 1. Plantar fasciitis     Meds ordered this encounter  Medications   diflunisal (DOLOBID) 500 MG TABS tablet    Sig: Take 1 tablet (500 mg total) by mouth 2 (two) times daily.    Dispense:  60 tablet    Refill:  2   DG FOOT COMPLETE RIGHT DG FOOT COMPLETE LEFT  -Reviewed etiology of plantar fasciitis with patient.  Discussed treatment options with patient today, including cortisone injection, NSAID course of treatment, stretching exercises, physical therapy, use of night splint, rest, icing the heel, arch supports/orthotics, and supportive shoe gear.    Recommended power step inserts and the plantar fascial brace today.  These were fitted and dispensed.  Recommended a night splint to prevent the pain with the feet with the for steps out of bed in the morning, but the patient declined.  She may try to purchase 1 off of [employer]  Will hold off on a cortisone injection today and placed the patient on diflunisal 500 mg tablets 1 tablet p.o. twice daily for up to 30 days.  Will check with our pedorthist for the status of our orthotic sandal program.  Informed the patient I was not 100% sure we were doing that this year.  Will need to get back to the patient once I hear from our orthotist.  Return in about  4 weeks (around 06/26/2023) for f/u plantar fasciitis.   Joe Murders, DPM, FACFAS [facility]     [address] [place], [place] [address]                Office [phone]  Fax [phone]

## 2023-06-01 ENCOUNTER — Ambulatory Visit: Admitting: Orthopedic Surgery

## 2023-06-20 ENCOUNTER — Ambulatory Visit (INDEPENDENT_AMBULATORY_CARE_PROVIDER_SITE_OTHER): Admitting: Family Medicine

## 2023-06-20 ENCOUNTER — Encounter: Payer: Self-pay | Admitting: Family Medicine

## 2023-06-20 VITALS — BP 128/80 | HR 95 | Resp 12 | Ht 64.0 in | Wt 237.4 lb

## 2023-06-20 DIAGNOSIS — E66813 Obesity, class 3: Secondary | ICD-10-CM | POA: Diagnosis not present

## 2023-06-20 DIAGNOSIS — E282 Polycystic ovarian syndrome: Secondary | ICD-10-CM | POA: Diagnosis not present

## 2023-06-20 DIAGNOSIS — R7303 Prediabetes: Secondary | ICD-10-CM

## 2023-06-20 DIAGNOSIS — Z6841 Body Mass Index (BMI) 40.0 and over, adult: Secondary | ICD-10-CM | POA: Diagnosis not present

## 2023-06-20 LAB — POCT GLYCOSYLATED HEMOGLOBIN (HGB A1C): HbA1c, POC (prediabetic range): 5.7 % (ref 5.7–6.4)

## 2023-06-20 NOTE — Assessment & Plan Note (Addendum)
 Lost about 2 Lb since her last visit. She understands the importance of achieving a healthier wt for chronic disease prevention. Encouraged to increase physical activity and to be consistent through the year. We discussed a few dietary options including calory restriction, intermittent fasting, small bites of protein through the day, avoid eating after 7-8 pm,using an appt to document foods.

## 2023-06-20 NOTE — Assessment & Plan Note (Signed)
 HgA1C improved,today 5.7, it was 6.0 in 03/2023. Encouraged consistency with a healthy lifestyle for diabetes prevention.

## 2023-06-20 NOTE — Patient Instructions (Addendum)
 A few things to remember from today's visit:  Prediabetes - Plan: POC HgB A1c  PCOS (polycystic ovarian syndrome) Continue monitoring for changes. Try to walk for 15 min at least about 3-5 times per week.  Do not use My Chart to request refills or for acute issues that need immediate attention. If you send a my chart message, it may take a few days to be addressed, specially if I am not in the office.  Please be sure medication list is accurate. If a new problem present, please set up appointment sooner than planned today.

## 2023-06-20 NOTE — Assessment & Plan Note (Signed)
 Menstrual cycles spaced some but still in normal range, q 30-35 days instead q 25-28 days as she was having them while on Metformin . She agrees with no resuming Metformin  for now. F/U in 03/2024, she will let me know if any changes before then.

## 2023-06-20 NOTE — Progress Notes (Signed)
 HPI: PatriciaPatricia Weaver is a 38 y.o. female  with a PMHx significant for PCOS, HLD, status post cholecystectomy, prediabetes, asthma, Vitamin D  deficiency, and B12 deficiency, who is here today for chronic disease management.  Last seen on 03/27/2023  PCOS: Pt was previously on Metformin  500 mg BID for about 15 years; was discontinued at her last visit on 2/24.  Overall her menses have been regular.  Current menses cycle lasts 30-35 days; was previously 25-28.  She notes her cycle would last 50-60 days in the past.  LMP: today 06/20/23  Prediabetes: Trying to be more consistent with regular physical activity.  Has tried to make healthier eating choices, but states she gets hungry "all the time.".  Tried intermittent fasting in the past, about 6-7 years ago and helped.  Lab Results  Component Value Date   HGBA1C 6.0 03/27/2023   HGBA1C 5.5 02/14/2022   HGBA1C 5.9 04/26/2021   Review of Systems  Constitutional:  Negative for activity change, appetite change and fever.  Respiratory:  Negative for cough and shortness of breath.   Gastrointestinal:  Negative for abdominal pain, nausea and vomiting.  Endocrine: Negative for cold intolerance and heat intolerance.  Genitourinary:  Negative for decreased urine volume, hematuria and pelvic pain.  Skin:  Negative for rash.  Neurological:  Negative for syncope, weakness and headaches.  See other pertinent positives and negatives in HPI.  Current Outpatient Medications on File Prior to Visit  Medication Sig Dispense Refill   Cetirizine HCl (ZYRTEC ALLERGY PO) Take 1 tablet by mouth daily.     cholestyramine  (QUESTRAN ) 4 g packet MIX AND DRINK 1 PACKET (4 GRAMS TOTAL) DAILY 90 packet 3   diflunisal  (DOLOBID ) 500 MG TABS tablet Take 1 tablet (500 mg total) by mouth 2 (two) times daily. 60 tablet 2   ibuprofen (ADVIL,MOTRIN) 200 MG tablet Take 400 mg by mouth daily as needed for headache or moderate pain.     No current facility-administered  medications on file prior to visit.    Past Medical History:  Diagnosis Date   Asthma    Dysmenorrhea    GERD (gastroesophageal reflux disease)    Headache    migraines   History of chicken pox    IBS (irritable bowel syndrome)    PCOS (polycystic ovarian syndrome)    Pre-diabetes    Vitamin B 12 deficiency    No Known Allergies  Social History   Socioeconomic History   Marital status: Married    Spouse name: Not on file   Number of children: Not on file   Years of education: 1   Highest education level: 12th grade  Occupational History   Not on file  Tobacco Use   Smoking status: Never   Smokeless tobacco: Never  Vaping Use   Vaping status: Never Used  Substance and Sexual Activity   Alcohol use: No   Drug use: Never   Sexual activity: Yes    Partners: Male    Birth control/protection: Other-see comments    Comment: Vasectomy  Other Topics Concern   Not on file  Social History Narrative   Not on file   Social Drivers of Health   Financial Resource Strain: Low Risk  (03/26/2023)   Overall Financial Resource Strain (CARDIA)    Difficulty of Paying Living Expenses: Not hard at all  Food Insecurity: No Food Insecurity (03/26/2023)   Hunger Vital Sign    Worried About Running Out of Food in the Last Year: Never  true    Ran Out of Food in the Last Year: Never true  Transportation Needs: No Transportation Needs (03/26/2023)   PRAPARE - Administrator, Civil Service (Medical): No    Lack of Transportation (Non-Medical): No  Physical Activity: Unknown (03/26/2023)   Exercise Vital Sign    Days of Exercise per Week: 0 days    Minutes of Exercise per Session: Not on file  Stress: No Stress Concern Present (03/26/2023)   [employer] of Occupational Health - Occupational Stress Questionnaire    Feeling of Stress : Not at all  Social Connections: Moderately Integrated (03/26/2023)   Social Connection and Isolation Panel [NHANES]    Frequency of  Communication with Friends and Family: More than three times a week    Frequency of Social Gatherings with Friends and Family: More than three times a week    Attends Religious Services: Never    Database administrator or Organizations: Yes    Attends Banker Meetings: More than 4 times per year    Marital Status: Married   Vitals:   06/20/23 0909  BP: 128/80  Pulse: 95  Resp: 12  SpO2: 98%   Wt Readings from Last 3 Encounters:  06/20/23 237 lb 6 oz (107.7 kg)  03/27/23 239 lb (108.4 kg)  08/31/22 233 lb 8 oz (105.9 kg)   Body mass index is 40.75 kg/m.  Physical Exam Vitals and nursing note reviewed.  Constitutional:      General: She is not in acute distress.    Appearance: She is well-developed.  HENT:     Head: Normocephalic and atraumatic.  Eyes:     Conjunctiva/sclera: Conjunctivae normal.  Cardiovascular:     Rate and Rhythm: Normal rate and regular rhythm.     Heart sounds: No murmur heard. Pulmonary:     Effort: Pulmonary effort is normal. No respiratory distress.     Breath sounds: Normal breath sounds.  Abdominal:     Palpations: Abdomen is soft. There is no hepatomegaly or mass.     Tenderness: There is no abdominal tenderness.  Lymphadenopathy:     Cervical: No cervical adenopathy.  Skin:    General: Skin is warm.     Findings: No erythema or rash.  Neurological:     General: No focal deficit present.     Mental Status: She is alert and oriented to person, place, and time.     Gait: Gait normal.  Psychiatric:        Mood and Affect: Mood and affect normal.   ASSESSMENT AND PLAN: Patricia Weaver was seen today for chronic disease management.  Orders Placed This Encounter  Procedures   POC HgB A1c  Prediabetes Assessment & Plan: HgA1C improved,today 5.7, it was 6.0 in 03/2023. Encouraged consistency with a healthy lifestyle for diabetes prevention.  Orders: -     POCT glycosylated hemoglobin (Hb A1C)  PCOS (polycystic ovarian  syndrome) Assessment & Plan: Menstrual cycles spaced some but still in normal range, q 30-35 days instead q 25-28 days as she was having them while on Metformin . She agrees with no resuming Metformin  for now. F/U in 03/2024, she will let me know if any changes before then.   Class 3 severe obesity with serious comorbidity and body mass index (BMI) of 40.0 to 44.9 in adult Assessment & Plan: Lost about 2 Lb since her last visit. She understands the importance of achieving a healthier wt for chronic disease prevention.  Encouraged to increase physical activity and to be consistent through the year. We discussed a few dietary options including calory restriction, intermittent fasting, small bites of protein through the day, avoid eating after 7-8 pm,using an appt to document foods.   Return in about 9 months (around 03/29/2024) for CPE, chronic problems, Labs.  I, Bernita Bristle, acting as a scribe for Yaslyn Cumby [place], MD., have documented all relevant documentation on the behalf of Odell Choung [place], MD, as directed by   while in the presence of Calixto Pavel [place], MD.  I, Marcile Fuquay [place], MD, have reviewed all documentation for this visit. The documentation on 06/20/23 for the exam, diagnosis, procedures, and orders are all accurate and complete.  Deaundra Dupriest G. [place], MD  [facility] [place]. [place] office.

## 2023-07-03 ENCOUNTER — Ambulatory Visit (INDEPENDENT_AMBULATORY_CARE_PROVIDER_SITE_OTHER): Admitting: Podiatry

## 2023-07-03 DIAGNOSIS — M722 Plantar fascial fibromatosis: Secondary | ICD-10-CM

## 2023-07-03 MED ORDER — TRIAMCINOLONE ACETONIDE 10 MG/ML IJ SUSP
10.0000 mg | Freq: Once | INTRAMUSCULAR | Status: AC
  Administered 2023-07-03: 10 mg

## 2023-07-03 NOTE — Progress Notes (Signed)
 Chief Complaint  Patient presents with   Plantar Fasciitis    Pt is here for a follow up of bilateral plantar fasciitis she stated that things are pretty much the same the pain is no better but has not gotten worse.    HPI: 38 y.o. female presenting today for follow-up of bilateral plantar fasciitis.  She has not been taking the prescribed NSAIDs regularly due to concern about taking anti-inflammatories.  She feels that when she was taking it more regularly that she felt there was better improvement of her symptoms.  She states the left heel hurts more than the right today.  She is performing her stretching exercises.  She is wearing the power step inserts.  Past Medical History:  Diagnosis Date   Asthma    Dysmenorrhea    GERD (gastroesophageal reflux disease)    Headache    migraines   History of chicken pox    IBS (irritable bowel syndrome)    PCOS (polycystic ovarian syndrome)    Pre-diabetes    Vitamin B 12 deficiency    Past Surgical History:  Procedure Laterality Date   CHOLECYSTECTOMY N/A 10/05/2016   Procedure: LAPAROSCOPIC CHOLECYSTECTOMY;  Surgeon: Adalberto Acton, MD;  Location: [facility] OR;  Service: General;  Laterality: N/A;   WISDOM TOOTH EXTRACTION     No Known Allergies   Physical Exam: General: The patient is alert and oriented x3 in no acute distress.  Dermatology:  No ecchymosis, erythema, or edema bilateral.  No open lesions.    Vascular: Palpable pedal pulses bilaterally. Capillary refill within normal limits.  No appreciable edema.    Neurological: Epicritic sensation is intact  Musculoskeletal Exam:  There is pain on palpation of the plantarmedial & plantarcentral aspect of bilateral heel, left worse than right.  No gaps or nodules within the plantar fascia.  Positive Windlass mechanism bilateral.  Antalgic gait noted with first few steps upon standing.  No pain on palpation of achilles tendon bilateral.  Ankle df less than 10 degrees with knee extended  b/l.  Assessment/Plan of Care: 1. Plantar fasciitis   2. Plantar fasciitis of left foot   3. Plantar fasciitis of right foot     Meds ordered this encounter  Medications   triamcinolone acetonide (KENALOG) 10 MG/ML injection 10 mg   Reviewed findings with the patient today.  Recommend that we at least try a cortisone injection on the left foot due to her continued pain which is not improving with typical conservative measures.  With the patient's verbal consent, a corticosteroid injection was administered to the left heel, consisting of a mixture of 1% lidocaine  plain, 0.5% Sensorcaine  plain, and Kenalog-10 for a total of 1.5cc administered.  A Band-aid was applied. Pain level post-injection is 5/10.  Stretching exercises to resume in 2 to 3 days postinjection.  Continue with Powerstep inserts.  She may also try the power step high arch inserts which she can obtain from [employer].  She may also want to consider a custom molded orthotic if she needs additional support over what the power steps can provide.  Recommend that she take the anti-inflammatories twice daily for at least 10 days to 2 weeks consistently before tapering to an as-needed basis.  Need this to become a steady state in her bloodstream to help focus on the inflammation and pain.  She stated she may try that.  Return if symptoms worsen or fail to improve.   Joe Murders, DPM, FACFAS [facility]  [address] [place], [place] [address]                Office [phone]  Fax [phone]

## 2023-07-04 ENCOUNTER — Ambulatory Visit: Admitting: Podiatry

## 2023-11-21 ENCOUNTER — Ambulatory Visit: Admitting: Podiatry

## 2023-12-04 ENCOUNTER — Encounter: Payer: Self-pay | Admitting: Radiology
